# Patient Record
Sex: Female | Born: 1935 | Race: White | Hispanic: No | Smoking: Never smoker
Health system: Southern US, Community
[De-identification: ages and names within clinical notes are randomized; demographics above are authoritative.]

## PROBLEM LIST (undated history)

## (undated) DIAGNOSIS — F419 Anxiety disorder, unspecified: Secondary | ICD-10-CM

## (undated) DIAGNOSIS — F329 Major depressive disorder, single episode, unspecified: Secondary | ICD-10-CM

## (undated) DIAGNOSIS — I639 Cerebral infarction, unspecified: Secondary | ICD-10-CM

## (undated) DIAGNOSIS — I10 Essential (primary) hypertension: Secondary | ICD-10-CM

## (undated) DIAGNOSIS — E785 Hyperlipidemia, unspecified: Secondary | ICD-10-CM

## (undated) DIAGNOSIS — F32A Depression, unspecified: Secondary | ICD-10-CM

## (undated) DIAGNOSIS — I442 Atrioventricular block, complete: Secondary | ICD-10-CM

---
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
## 2001-09-10 ENCOUNTER — Other Ambulatory Visit: Admission: RE | Admit: 2001-09-10 | Discharge: 2001-09-10 | Payer: Self-pay | Admitting: Oncology

## 2001-09-10 ENCOUNTER — Ambulatory Visit (HOSPITAL_COMMUNITY): Admission: RE | Admit: 2001-09-10 | Discharge: 2001-09-10 | Payer: Self-pay | Admitting: Oncology

## 2001-09-10 ENCOUNTER — Encounter: Payer: Self-pay | Admitting: Oncology

## 2004-10-09 ENCOUNTER — Ambulatory Visit: Payer: Self-pay | Admitting: Oncology

## 2005-01-02 ENCOUNTER — Ambulatory Visit: Payer: Self-pay | Admitting: Hematology

## 2005-01-31 ENCOUNTER — Ambulatory Visit: Payer: Self-pay | Admitting: Oncology

## 2005-04-12 ENCOUNTER — Ambulatory Visit: Payer: Self-pay | Admitting: Oncology

## 2005-08-03 ENCOUNTER — Ambulatory Visit: Payer: Self-pay | Admitting: Oncology

## 2005-10-26 ENCOUNTER — Ambulatory Visit: Payer: Self-pay | Admitting: Oncology

## 2005-12-28 ENCOUNTER — Ambulatory Visit: Payer: Self-pay | Admitting: Oncology

## 2006-02-15 ENCOUNTER — Ambulatory Visit: Payer: Self-pay | Admitting: Oncology

## 2006-06-05 ENCOUNTER — Ambulatory Visit: Payer: Self-pay | Admitting: Oncology

## 2006-09-11 ENCOUNTER — Ambulatory Visit: Payer: Self-pay | Admitting: Oncology

## 2006-11-06 ENCOUNTER — Ambulatory Visit: Payer: Self-pay | Admitting: Oncology

## 2007-04-02 ENCOUNTER — Ambulatory Visit: Payer: Self-pay | Admitting: Oncology

## 2013-12-09 ENCOUNTER — Other Ambulatory Visit: Payer: Self-pay | Admitting: Oncology

## 2013-12-09 DIAGNOSIS — C8589 Other specified types of non-Hodgkin lymphoma, extranodal and solid organ sites: Secondary | ICD-10-CM

## 2013-12-17 ENCOUNTER — Ambulatory Visit (HOSPITAL_COMMUNITY)
Admission: RE | Admit: 2013-12-17 | Discharge: 2013-12-17 | Disposition: A | Discharge status: Home / Self Care | Payer: Medicare Other | Source: Ambulatory Visit | Attending: Oncology | Admitting: Oncology

## 2013-12-17 DIAGNOSIS — C8589 Other specified types of non-Hodgkin lymphoma, extranodal and solid organ sites: Secondary | ICD-10-CM | POA: Insufficient documentation

## 2013-12-17 LAB — GLUCOSE, CAPILLARY: Glucose-Capillary: 113 mg/dL — ABNORMAL HIGH (ref 70–99)

## 2013-12-17 MED ORDER — FLUDEOXYGLUCOSE F - 18 (FDG) INJECTION
7.4000 | Freq: Once | INTRAVENOUS | Status: AC | PRN
  Administered 2013-12-17: 7.4 via INTRAVENOUS

## 2015-10-31 IMAGING — CT NM PET TUM IMG RESTAG (PS) SKULL BASE T - THIGH
1 of 6 series · 1 of 25 positions shown · non-contrast
Comparison: None.

CLINICAL DATA: Initial treatment strategy for lymphoma.

EXAM:
NUCLEAR MEDICINE PET SKULL BASE TO THIGH
FASTING BLOOD GLUCOSE:  Value: 113 mg/dl
TECHNIQUE: 7.4 mCi F-18 FDG was injected intravenously. Full-ring PET imaging
was performed from the skull base to thigh after the radiotracer. CT
data was obtained and used for attenuation correction and anatomic
localization.

[Series 3: pet sk_thigh ac · axial · 5.0mm · 4.07mm/px · 1 of 201 slices shown]
[im 101/201]
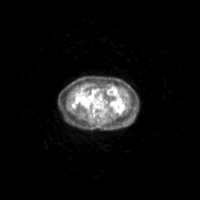

[1 of 25 positions shown; findings below may reference images not displayed]

FINDINGS: NECK

Nonspecific asymmetric increased radiotracer uptake is identified
within the left tonsillar region corresponding to increased soft
tissue fullness on the CT images. The SUV max within this area is
equal to 6.6, image 7. Mild increased radiotracer uptake is
identified within right level 2 lymph node this has an SUV max equal
to 3.7, image 14.

CHEST

No hypermetabolic mediastinal or hilar nodes. No suspicious
pulmonary nodules on the CT scan. Scarring is identified within both
post posterior lung bases.

ABDOMEN/PELVIS

No abnormal hypermetabolic activity within the liver, pancreas,
adrenal glands, or spleen. No hypermetabolic lymph nodes in the
abdomen or pelvis.

SKELETON

Mildly heterogeneous radiotracer uptake is identified throughout the
spine. More focal area of increased radiotracer uptake is identified
at the approximate T7 level.
IMPRESSION: 1. There are no specific features identified to suggest a lymphoma
within the neck chest abdomen or pelvis.
2. Subtle asymmetric increased radiotracer uptake is associated with
the left tonsillar region corresponding to the recent CT of the
neck. Consider further assessment with tissue sampling.
3. Mild increased uptake is associated with right sided level to
lymph node.
4. Heterogeneous of bone marrow activity is noted particularly
involving the spine. If there is a high clinical concern for bone
marrow involvement by lymphoma consider further assessment with bone
marrow biopsy.

## 2015-11-17 DIAGNOSIS — M810 Age-related osteoporosis without current pathological fracture: Secondary | ICD-10-CM

## 2015-11-17 DIAGNOSIS — C8519 Unspecified B-cell lymphoma, extranodal and solid organ sites: Secondary | ICD-10-CM | POA: Diagnosis not present

## 2016-05-16 DIAGNOSIS — Z8572 Personal history of non-Hodgkin lymphomas: Secondary | ICD-10-CM

## 2017-07-04 DIAGNOSIS — C8519 Unspecified B-cell lymphoma, extranodal and solid organ sites: Secondary | ICD-10-CM | POA: Diagnosis not present

## 2019-04-08 ENCOUNTER — Other Ambulatory Visit: Payer: Self-pay | Admitting: Physician Assistant

## 2019-04-08 ENCOUNTER — Other Ambulatory Visit: Payer: Self-pay

## 2019-04-08 ENCOUNTER — Observation Stay (HOSPITAL_COMMUNITY)
Admission: AD | Admit: 2019-04-08 | Discharge: 2019-04-08 | Disposition: A | Discharge status: Home / Self Care | Payer: Medicare (Managed Care) | Source: Other Acute Inpatient Hospital | Attending: Internal Medicine | Admitting: Internal Medicine

## 2019-04-08 ENCOUNTER — Observation Stay (HOSPITAL_BASED_OUTPATIENT_CLINIC_OR_DEPARTMENT_OTHER): Payer: Medicare (Managed Care)

## 2019-04-08 DIAGNOSIS — I69354 Hemiplegia and hemiparesis following cerebral infarction affecting left non-dominant side: Secondary | ICD-10-CM | POA: Insufficient documentation

## 2019-04-08 DIAGNOSIS — Z79899 Other long term (current) drug therapy: Secondary | ICD-10-CM | POA: Diagnosis not present

## 2019-04-08 DIAGNOSIS — I10 Essential (primary) hypertension: Secondary | ICD-10-CM | POA: Insufficient documentation

## 2019-04-08 DIAGNOSIS — J449 Chronic obstructive pulmonary disease, unspecified: Secondary | ICD-10-CM | POA: Insufficient documentation

## 2019-04-08 DIAGNOSIS — F329 Major depressive disorder, single episode, unspecified: Secondary | ICD-10-CM | POA: Insufficient documentation

## 2019-04-08 DIAGNOSIS — R42 Dizziness and giddiness: Secondary | ICD-10-CM | POA: Insufficient documentation

## 2019-04-08 DIAGNOSIS — I447 Left bundle-branch block, unspecified: Secondary | ICD-10-CM | POA: Insufficient documentation

## 2019-04-08 DIAGNOSIS — F419 Anxiety disorder, unspecified: Secondary | ICD-10-CM | POA: Insufficient documentation

## 2019-04-08 DIAGNOSIS — E785 Hyperlipidemia, unspecified: Secondary | ICD-10-CM | POA: Diagnosis not present

## 2019-04-08 DIAGNOSIS — I440 Atrioventricular block, first degree: Secondary | ICD-10-CM | POA: Diagnosis not present

## 2019-04-08 DIAGNOSIS — R531 Weakness: Principal | ICD-10-CM | POA: Insufficient documentation

## 2019-04-08 DIAGNOSIS — R001 Bradycardia, unspecified: Secondary | ICD-10-CM | POA: Diagnosis not present

## 2019-04-08 DIAGNOSIS — R55 Syncope and collapse: Secondary | ICD-10-CM

## 2019-04-08 DIAGNOSIS — Z1159 Encounter for screening for other viral diseases: Secondary | ICD-10-CM | POA: Diagnosis not present

## 2019-04-08 DIAGNOSIS — Z8572 Personal history of non-Hodgkin lymphomas: Secondary | ICD-10-CM | POA: Diagnosis not present

## 2019-04-08 LAB — BASIC METABOLIC PANEL
Anion gap: 7 (ref 5–15)
BUN: 12 mg/dL (ref 8–23)
CO2: 26 mmol/L (ref 22–32)
Calcium: 8.8 mg/dL — ABNORMAL LOW (ref 8.9–10.3)
Chloride: 104 mmol/L (ref 98–111)
Creatinine, Ser: 0.73 mg/dL (ref 0.44–1.00)
GFR calc Af Amer: >60 mL/min (ref >60)
GFR calc non Af Amer: >60 mL/min (ref >60)
Glucose, Bld: 93 mg/dL (ref 70–99)
Potassium: 4 mmol/L (ref 3.5–5.1)
Sodium: 137 mmol/L (ref 135–145)

## 2019-04-08 LAB — SARS CORONAVIRUS 2: SARS Coronavirus 2: NOT DETECTED

## 2019-04-08 LAB — CBC WITH DIFFERENTIAL/PLATELET
Abs Immature Granulocytes: 0.02 10*3/uL (ref 0.00–0.07)
Basophils Absolute: 0 10*3/uL (ref 0.0–0.1)
Basophils Relative: 1 %
Eosinophils Absolute: 0.1 10*3/uL (ref 0.0–0.5)
Eosinophils Relative: 2 %
HCT: 36.6 % (ref 36.0–46.0)
Hemoglobin: 12.4 g/dL (ref 12.0–15.0)
Immature Granulocytes: 0 %
Lymphocytes Relative: 34 %
Lymphs Abs: 1.7 10*3/uL (ref 0.7–4.0)
MCH: 30.8 pg (ref 26.0–34.0)
MCHC: 33.9 g/dL (ref 30.0–36.0)
MCV: 91 fL (ref 80.0–100.0)
Monocytes Absolute: 0.5 10*3/uL (ref 0.1–1.0)
Monocytes Relative: 10 %
Neutro Abs: 2.6 10*3/uL (ref 1.7–7.7)
Neutrophils Relative %: 53 %
Platelets: 202 10*3/uL (ref 150–400)
RBC: 4.02 MIL/uL (ref 3.87–5.11)
RDW: 13.2 % (ref 11.5–15.5)
WBC: 5 10*3/uL (ref 4.0–10.5)
nRBC: 0 % (ref 0.0–0.2)

## 2019-04-08 LAB — TROPONIN I: Troponin I: <0.03 ng/mL (ref <0.03)

## 2019-04-08 LAB — ECHOCARDIOGRAM COMPLETE
Height: 61 in
Weight: 2115.2 oz

## 2019-04-08 LAB — PROTIME-INR
INR: 1.1 (ref 0.8–1.2)
Prothrombin Time: 13.8 seconds (ref 11.4–15.2)

## 2019-04-08 LAB — TSH: TSH: 3.365 u[IU]/mL (ref 0.350–4.500)

## 2019-04-08 LAB — APTT: aPTT: 33 seconds (ref 24–36)

## 2019-04-08 MED ORDER — SERTRALINE HCL 100 MG PO TABS
100.0000 mg | ORAL_TABLET | Freq: Every day | ORAL | Status: DC
  Administered 2019-04-08: 100 mg via ORAL
  Filled 2019-04-08: qty 1

## 2019-04-08 MED ORDER — ROSUVASTATIN CALCIUM 5 MG PO TABS: 10.0000 mg | ORAL_TABLET | Freq: Every day | ORAL | Status: DC

## 2019-04-08 MED ORDER — ACETAMINOPHEN 325 MG PO TABS
650.0000 mg | ORAL_TABLET | Freq: Four times a day (QID) | ORAL | Status: DC | PRN
  Administered 2019-04-08: 650 mg via ORAL
  Filled 2019-04-08: qty 2

## 2019-04-08 MED ORDER — ONDANSETRON HCL 4 MG/2ML IJ SOLN: 4.0000 mg | Freq: Four times a day (QID) | INTRAMUSCULAR | Status: DC | PRN

## 2019-04-08 MED ORDER — SODIUM CHLORIDE 0.9 % IV SOLN
INTRAVENOUS | Status: AC
  Administered 2019-04-08: 15:00:00 via INTRAVENOUS

## 2019-04-08 NOTE — Progress Notes (Signed)
Echocardiogram 2D Echocardiogram has been performed.  Matilde Bash 04/08/2019, 11:06 AM

## 2019-04-08 NOTE — H&P (Signed)
Cardiology History & Physical    Patient ID: Bethany Nichols MRN: [id number], DOB: [birth year] Date of Encounter: 04/08/2019, 4:46 AM Primary Physician: Marco Collie, MD  Chief Complaint: Lightheadedness   HPI: Bethany Nichols is a 85 y.o. female with history of HLD, COPD, prior stroke, remote lymphoma who presents with intermittent lightheadedness.  The pt had been doing well until 1-2 days PTA, when she noted the onset of intermittent lightheadedness.  The symptoms typically came on shortly after standing up and usually resolved within minutes.  She denied any associated CP, SOB, orthopnea, or frank syncope.  She has not had any fevers or chills.  Her son took vital signs at home this afternoon following one of these episodes and noted at HR in th 64s.  She was taken to the [facility] ED for evaluation.  There, her HR was in the 50s-60s and the remainder of her VS were WNL.  ECG showed LBBB and first degree AVB with occasional PACs.  Orthostatic vital signs were WNL.  Her labs were largely unremarkable, save for Na 131.  Her Cr was 0.7 and CBC was WNL.  Initial troponin and COVID screening swab were both negative.  Given concerns for possible bradyarrhythmia, she was transferred to [facility] for further management.   PMH: HLD Stroke Lymphoma COPD Anxiety  Home Meds: Sertraline 100 mg QD Crestor 10 mg QD Clonazepam 0.5 mg QHS PRN Clopidogrel 75 mg QD Duoneb PRN  Allergies:  Allergies  Allergen Reactions  . Levofloxacin Rash    Social History   Socioeconomic History  . Marital status: Divorced    Spouse name: Not on file  . Number of children: Not on file  . Years of education: Not on file  . Highest education level: Not on file  Occupational History  . Not on file  Social Needs  . Financial resource strain: Not on file  . Food insecurity:    Worry: Not on file    Inability: Not on file  . Transportation needs:    Medical: Not on file    Non-medical: Not on file   Tobacco Use  . Smoking status: Not on file  Substance and Sexual Activity  . Alcohol use: Not on file  . Drug use: Not on file  . Sexual activity: Not on file  Lifestyle  . Physical activity:    Days per week: Not on file    Minutes per session: Not on file  . Stress: Not on file  Relationships  . Social connections:    Talks on phone: Not on file    Gets together: Not on file    Attends religious service: Not on file    Active member of club or organization: Not on file    Attends meetings of clubs or organizations: Not on file    Relationship status: Not on file  . Intimate partner violence:    Fear of current or ex partner: Not on file    Emotionally abused: Not on file    Physically abused: Not on file    Forced sexual activity: Not on file  Other Topics Concern  . Not on file  Social History Narrative  . Not on file     No family history on file.  Review of Systems: All other systems reviewed and are otherwise negative except as noted above.  Labs: BMP: 131/4.4  96/28  20/0.70 < 94 CBC: 5.5 > 12.2/36.0 < 216 Troponin I < 0.01 COVID PCR:  Negative  Radiology/Studies:  No results found. Wt Readings from Last 3 Encounters:  04/08/19 60 kg    EKG: Sinus bradycardia, rate 50s, occasional PAC followed by compensatory pause (approx 2s).  LBBB, 1st degree AVB.  Physical Exam: Blood pressure 137/75, pulse (!) 56, temperature 98.2 F (36.8 C), temperature source Oral, resp. rate 15, height 5\' 1"  (1.549 m), weight 60 kg, SpO2 96 %. Body mass index is 24.98 kg/m. General: Well developed, well nourished, in no acute distress. Head: Normocephalic, atraumatic, sclera non-icteric, no xanthomas, nares are without discharge.  Neck: Negative for carotid bruits. JVD not elevated. Lungs: Clear bilaterally to auscultation without wheezes, rales, or rhonchi. Breathing is unlabored. Heart: RRR with S1 S2. No murmurs, rubs, or gallops appreciated. Abdomen: Soft, non-tender,  non-distended with normoactive bowel sounds. No hepatomegaly. No rebound/guarding. No obvious abdominal masses. Msk:  Strength and tone appear normal for age. Extremities: No clubbing or cyanosis. No edema.  Distal pedal pulses are 2+ and equal bilaterally. Neuro: Alert and oriented X 3. No focal deficit. No facial asymmetry. Moves all extremities spontaneously. Psych:  Responds to questions appropriately with a normal affect.    Assessment and Plan  89F presents with intermittent lightheadedness, transferred to evaluate for possible underlying bradyarrythmia.  1.  Pre-syncope:  ECG with evidence of conduction disease - 1st degree AVB and LBBB.  Her 12 lead from [place] shows a PAC with a compensatory pause of nearly 2 seconds.  Thus far, no evidence of high degree AVB.  She requires some telemetry monitoring upfront as she does not meet clear indication for PPM at present.  Will recheck orthostatic VS.  Echocardiogram ordered.  2.  HLD: Continue home Crestor.  3.  Prior stroke: Have not re-ordered home Plavix yet, if she were to require a PPM.  4.  COPD: Duonebs PRN.  5.  Anxiety: Continue home sertraline.  6.  Code status: Full code, confirmed.  She has previously been DNR, but given her admission with a possible cardiac issue, she would prefer to remain full code while admitted.  Signed, Doylene Canning, MD 04/08/2019, 4:46 AM

## 2019-04-08 NOTE — Discharge Instructions (Signed)
Make sure you are eating and drinking adequate amount of water daily.  When standing up, do so slowly and carefully, monitoring for any symptoms.  Sit back down if needed.  If you do not receive the heart monitor at your home in the mail, please call Dr. Macky Lower main office to follow up [phone]

## 2019-04-08 NOTE — Discharge Summary (Addendum)
DISCHARGE SUMMARY    Patient ID: Bethany Nichols,  MRN: [id number], DOB/AGE: 1932/12/27 85 y.o.  Admit date: 04/08/2019 Discharge date: 04/08/2019  Primary Care Physician: Dr. Charletta Cousin, MD Darci Current, NP Primary Cardiologist/Electrophysiologist:   Primary Discharge Diagnosis:  1. Weakness, dizziness  Secondary Discharge Diagnosis:  1. HTN 2. HLD 3. Depression/anxiety 4. H/o prior stroke   Allergies  Allergen Reactions   Levofloxacin Rash   Allegra [Fexofenadine] Other (See Comments)    unknown     Procedures This Admission:     Brief HPI: Bethany Nichols is a 85 y.o. female with a hx of AAA, depression/anxiety, HLD, HTN, remote lymphoma (2001), old stroke with L sided weakness, resides at home with family near by, was taken to [facility] with concners of new reports to family of some lightheadedness and by a pulse check by her son reportedly got a pulse in the 30's.  At [facility] she was found in [place] generally 50's-60's, 1st degree AVblock and LBBB on her EKG, labs reported as unremarkable, though with reports by family of slow HR, transferred to [facility] for further evaluation with concerns of possible symptomatic bradycardia, ?abnormal EKG.     Hospital Course:  The patient was admitted and monitored on telemetry.  She has not had any particular symptoms here.   In lengthy discussion with the patient and in d/w her son via telephone, her symptoms are more suggestive of orthostatic dizziness.  Telemetry noted SB/SR generally 50's-60's, occ PACs and PVCs.  Her son reports the pulse ox read a HR of 31, as did a manual pulse taken by home.  Suspect this may have been secondary to her ectopy.  She has 1st degree AVblock and a LBBB without old EKGs to compare to.  Echo was done noting LVEF 60-65% without significant valvular heart disease.  The patient had positive orthostatic vitals.  She was ordered for a bolus of fluid.  Discussed with the patient and her  son via telephone suspect a component of dehydration, the importance of adequate oral intake with food and water.  Also discussed care upon standing and safety strategies.  Our office Larry Alcock send her an event monitor (Zio AT) for home enrollment and follow up with Dr. Curt Bears is in place   The patient was seen and examined by Dr. Curt Bears and considered stable for discharge to home.    Physical Exam: Vitals:   04/08/19 0351 04/08/19 0646 04/08/19 0817  BP: 137/75 130/73 133/73  Pulse: (!) 56 (!) 47 (!) 51  Resp: 15 (!) 21 17  Temp: 98.2 F (36.8 C) 97.8 F (36.6 C) 97.9 F (36.6 C)  TempSrc: Oral Oral Oral  SpO2: 96% 100% 94%  Weight: 60 kg    Height: 5\' 1"  (1.549 m)       Labs:   Lab Results  Component Value Date   WBC 5.0 04/08/2019   HGB 12.4 04/08/2019   HCT 36.6 04/08/2019   MCV 91.0 04/08/2019   PLT 202 04/08/2019    Recent Labs  Lab 04/08/19 0546  NA 137  K 4.0  CL 104  CO2 26  BUN 12  CREATININE 0.73  CALCIUM 8.8*  GLUCOSE 93    Discharge Medications:  Allergies as of 04/08/2019      Reactions   Levofloxacin Rash   Allegra [fexofenadine] Other (See Comments)   unknown      Medication List    TAKE these medications   acetaminophen 650  MG CR tablet Commonly known as:  TYLENOL Take 650 mg by mouth every 12 (twelve) hours as needed for pain.   alendronate 70 MG tablet Commonly known as:  FOSAMAX Take 70 mg by mouth once a week. Take with a full glass of water on an empty stomach.   aspirin EC 81 MG tablet Take 81 mg by mouth daily.   b complex vitamins tablet Take 1 tablet by mouth daily.   calcium carbonate 1500 (600 Ca) MG Tabs tablet Commonly known as:  OSCAL Take 600 mg of elemental calcium by mouth 2 (two) times daily with a meal.   calcium carbonate 500 MG chewable tablet Commonly known as:  TUMS - dosed in mg elemental calcium Chew 1 tablet by mouth 2 (two) times daily as needed for indigestion or heartburn.   cetaphil cream Apply  1 application topically 2 (two) times daily as needed (dry skin).   Melatonin 5 MG Tabs Take 5 mg by mouth at bedtime.   Muscle Rub 10-15 % Crea Apply 1 application topically 3 (three) times daily as needed for muscle pain.   sertraline 50 MG tablet Commonly known as:  ZOLOFT Take 50 mg by mouth daily.   vitamin E 1000 UNIT capsule Take 1,000 Units by mouth daily.       Disposition:  Home Discharge Instructions    Diet - low sodium heart healthy   Complete by:  As directed    Increase activity slowly   Complete by:  As directed      Follow-up Information    Constance Haw, MD Follow up.   Specialty:  Cardiology Why:  05/15/2019 @ 11:00AM, this Hasnain Manheim be a telephone/virtual visit.  If this is changed to an in-clinic visit (pending [place] clinic restrictions) you Truly Stankiewicz be notified Contact information: [place] [address] [phone]           Duration of Discharge Encounter: Greater than 30 minutes including physician time.  Venetia Night, PA-C 04/08/2019 3:53 PM  I have seen and examined this patient with Tommye Standard.  Agree with above, note added to reflect my findings.  On exam, RRR, no murmurs, lungs clear.  Patient presented to the hospital with dizziness and fatigue.  She does have a first-degree AV block and left bundle branch block.  Per her description, it sounds that her dizziness and fatigue is due to orthostasis, but with her conduction system disease, it would certainly benefit further monitoring.  We Kriti Katayama fit her with a ZIO patch.  Doninique Lwin M. Amaal Dimartino MD 04/09/2019 6:28 AM

## 2019-04-08 NOTE — Consult Note (Addendum)
Cardiology Consultation:   Patient ID: [name] MRN: [id number]; DOB: 1933-05-20  Admit date: 04/08/2019 Date of Consult: 04/08/2019  Primary Care Provider: Marco Collie, MD Primary Cardiologist: No primary care provider on file.  Primary Electrophysiologist:  None    Patient Profile:   Bethany Nichols is a 85 y.o. female with a hx of AAA, depression/anxiety, HLD, HTN, remote lymphoma (2001), old stroke with L sided weakness, resides at home with family near by who is being seen today for the evaluation of concerns of bardycardia at the request of Dr. [place] Nichols.  History of Present Illness:   [name] was taken to [facility] with concners of new reports to family of some lightheadedness and by a pulse check by her son reportedly got a pulse in the 30's.  At [facility] she was found in [place] generally 50's-60's, 1st degree AVblock and LBBB on her EKG, labs reported as unremarkable, though with reports by family of slow HR, transferred to [facility] for further evaluation with concerns of possible symptomatic bradycardia, ?abnormal EKG.    Colon 04/07/2019 K+ 4.4 Mag 2.30 BUN/Creat 20/0.70 Trop I: <0.01 WBC 5.5 H/H 12/36 Plts 216  COVID negative CXR w/NAD  EKG SB,, 57bpm, 1st degree AVblock, PR 260ms, PAC w/2 sec compensatory pause, LBBB Rhythm strips SR, 55-60bpm, PVC   The patient this am reports for about a week, she has been getting dizzy when she stands up, mentions once particularly getting up from the toilet.  She feels in general like her legs (at baseline she says are not strong since her stroke years ago) feel a little more weak then usual, generally more fatigued.  She denies to [place] today at any time feeling like she was going to faint, or fall, denies any near fainting.  She denies any kind of CP, palpitations or SOB.  She has had no cardiac awareness of any kind.  I spoke with her son Bethany Nichols, he confirms the patient lives independently and he lives  close by.  He reports that for a couple weeks his mom has mentioned [erhaps tired, generally weak and some dizzy spells, uncl;ear to him if only upon standing.  Yesterday particularly reported to him just not feeling well, he went to check on her said she did look like she was not feeling well, perhaps a bit lethargic even, and with a pulse ox, got HR of 31, sats in the high 90's.  He took a manual pulse and also got 30's, this is what prompted bringing the patient to the hospital.   Home Medications:  Prior to Admission medications   Not on File  Reviewed home list on hard copy chart, she is on no nodal blocking agents, mostly on minerals and vitamimns  Inpatient Medications: Scheduled Meds: . rosuvastatin  10 mg Oral q1800  . sertraline  100 mg Oral Daily   Continuous Infusions:  PRN Meds: acetaminophen, ondansetron (ZOFRAN) IV  Allergies:    Allergies  Allergen Reactions  . Levofloxacin Rash    Social History:   Social History   Socioeconomic History  . Marital status: Divorced    Spouse name: Not on file  . Number of children: Not on file  . Years of education: Not on file  . Highest education level: Not on file  Occupational History  . Not on file  Social Needs  . Financial resource strain: Not on file  . Food insecurity:    Worry: Not on file    Inability: Not  on file  . Transportation needs:    Medical: Not on file    Non-medical: Not on file  Tobacco Use  . Smoking status: Not on file  Substance and Sexual Activity  . Alcohol use: Not on file  . Drug use: Not on file  . Sexual activity: Not on file  Lifestyle  . Physical activity:    Days per week: Not on file    Minutes per session: Not on file  . Stress: Not on file  Relationships  . Social connections:    Talks on phone: Not on file    Gets together: Not on file    Attends religious service: Not on file    Active member of club or organization: Not on file    Attends meetings of clubs or  organizations: Not on file    Relationship status: Not on file  . Intimate partner violence:    Fear of current or ex partner: Not on file    Emotionally abused: Not on file    Physically abused: Not on file    Forced sexual activity: Not on file  Other Topics Concern  . Not on file  Social History Narrative  . Not on file    Family History:   Mother had breast cancer No cardiac family hx noted  ROS:  Please see the history of present illness.  All other ROS reviewed and negative.     Physical Exam/Data:   Vitals:   04/08/19 0351 04/08/19 0646 04/08/19 0817  BP: 137/75 130/73 133/73  Pulse: (!) 56 (!) 47 (!) 51  Resp: 15 (!) 21 17  Temp: 98.2 F (36.8 C) 97.8 F (36.6 C) 97.9 F (36.6 C)  TempSrc: Oral Oral Oral  SpO2: 96% 100% 94%  Weight: 60 kg    Height: 5\' 1"  (1.549 m)      Intake/Output Summary (Last 24 hours) at 04/08/2019 1023 Last data filed at 04/08/2019 0900 Gross per 24 hour  Intake 240 ml  Output 600 ml  Net -360 ml   Last 3 Weights 04/08/2019  Weight (lbs) 132 lb 3.2 oz  Weight (kg) 59.966 kg     Body mass index is 24.98 kg/m.  General:  Well nourished, well developed, in no acute distress HEENT: normal Lymph: no adenopathy Neck: no JVD Endocrine:  No thryomegaly Vascular: No carotid bruits Cardiac:  RRR; no murmurs, gallops or rubs Lungs:  CTA b/l, no wheezing, rhonchi or rales  Abd: soft, nontender Ext: no edema Musculoskeletal:  No deformities, age appropriate atrophy Skin: warm and dry  Neuro:  no focal abnormalities noted, pt reports leg weakness since her stroke Psych:  Normal affect, very pleasent  EKG:  The EKG was personally reviewed and demonstrates:    SR 61, 1st degree AVblock  251ms, , LBBB Telemetry:  Telemetry was personally reviewed and demonstrates:   SR/SB 50's-60's, occ PACs and PVCs, infrequent couplets, she has very brief compensatory pauses with narrow complex sinus beat afterwards No advanced heart block or marked  bradycardia is obsevrved  Relevant CV Studies:  Echo  Is completed, pending read  Laboratory Data:  Chemistry Recent Labs  Lab 04/08/19 0546  NA 137  K 4.0  CL 104  CO2 26  GLUCOSE 93  BUN 12  CREATININE 0.73  CALCIUM 8.8*  GFRNONAA >60  GFRAA >60  ANIONGAP 7    No results for input(s): PROT, ALBUMIN, AST, ALT, ALKPHOS, BILITOT in the last 168 hours. Hematology Recent Labs  Lab 04/08/19 0546  WBC 5.0  RBC 4.02  HGB 12.4  HCT 36.6  MCV 91.0  MCH 30.8  MCHC 33.9  RDW 13.2  PLT 202   Cardiac Enzymes Recent Labs  Lab 04/08/19 0546  TROPONINI <0.03   No results for input(s): TROPIPOC in the last 168 hours.  BNPNo results for input(s): BNP, PROBNP in the last 168 hours.  DDimer No results for input(s): DDIMER in the last 168 hours.  Radiology/Studies:  No results found.  Assessment and Plan:   1. Vague weakness in the last week, more so them her baseline     Dizziness upon standing from the toilet     Legs generally seem weaker then usual, feels more unsteady on her feet     No reports of near syncope or syncope  Story sounds more of orthostatic symptoms She has intermittent ectopy that may account for slow pulse by the pulse ox/manual pulse noted by family at home She has baseline conduction system disease, no old EKGs to know if LBBB is new  Hard to make a case for symptomatic bradycardia at this juncture  Avyonna Wagoner get a set of orthostatic vitals done The patient encouraged to keep adequate oral hydration and intake Echo is completed, pending read  Likely to discharge with event monitoring (Zio AT) and outpatient follow up.   For questions or updates, please contact [place] Please consult [email or web address] for contact info under     Signed, Baldwin Jamaica, PA-C  04/08/2019 10:23 AM  I have seen and examined this patient with Tommye Standard.  Agree with above, note added to reflect my findings.  On exam, RRR, no murmurs, lungs clear.  Patient  present to the hospital with episodes of fatigue.  She was noted to have heart rates in the 30s at home.  This point is unclear to me as to what is causing this.  It could certainly be PVC induced versus heart block.  She does have conduction system disease.  Telemetry today shows evidence of likely aberrancy with long short intervals, though the short intervals are quite normal.  She has not had any atrial fibrillation or significant bradycardia.  Due to that, we Britanie Harshman likely fit her with a ZIO patch as an outpatient.  Darthula Desa M. Celise Bazar MD 04/08/2019 12:55 PM

## 2019-04-08 NOTE — Progress Notes (Signed)
IV and telemetry discontinued at this time. CCMD notified. Discharge instructions reviewed with patient and patient's son, [name] over the phone.

## 2019-04-09 ENCOUNTER — Telehealth: Payer: Self-pay | Admitting: Radiology

## 2019-04-09 NOTE — Telephone Encounter (Signed)
Enrolled patient for a 14 Day Zio Telemetry monitor to be mailed. Brief instructions were gone over and she knows to expect the monitor to arrive in 3-4 days

## 2019-04-12 ENCOUNTER — Telehealth: Payer: Self-pay | Admitting: Student

## 2019-04-12 NOTE — Telephone Encounter (Signed)
    Patient's sister called the after hours line reporting the patient had episodes of bradycardia and dizziness earlier this morning. Was recently admitted with similar symptoms. Not on any AV nodal blocking agents.   HR checked at time of call and improved into the 60's. Patient denies any symptoms at this time.   Zio monitor was ordered at the time of discharge and should be arriving within the next 1-2 days. I told them to call the office if this has not arrived by mid-week.  They will continue to monitor her HR and BP. Will forward to [facility] and Dr. Curt Bears to make them aware.   Signed, Erma Heritage, PA-C 04/12/2019, 3:31 PM Pager: [phone]

## 2019-04-13 ENCOUNTER — Ambulatory Visit (INDEPENDENT_AMBULATORY_CARE_PROVIDER_SITE_OTHER): Payer: Medicare (Managed Care)

## 2019-04-13 DIAGNOSIS — R42 Dizziness and giddiness: Secondary | ICD-10-CM

## 2019-04-16 ENCOUNTER — Encounter (HOSPITAL_COMMUNITY): Payer: Self-pay | Admitting: Nurse Practitioner

## 2019-04-16 ENCOUNTER — Encounter (HOSPITAL_COMMUNITY)
Admission: RE | Disposition: A | Discharge status: Home / Self Care | Payer: Self-pay | Source: Ambulatory Visit | Attending: Cardiology

## 2019-04-16 ENCOUNTER — Other Ambulatory Visit (HOSPITAL_COMMUNITY): Payer: Self-pay | Admitting: *Deleted

## 2019-04-16 ENCOUNTER — Telehealth: Payer: Self-pay | Admitting: Cardiology

## 2019-04-16 ENCOUNTER — Ambulatory Visit (HOSPITAL_COMMUNITY)
Admission: RE | Admit: 2019-04-16 | Discharge: 2019-04-17 | Disposition: A | Discharge status: Home / Self Care | Payer: No Typology Code available for payment source | Source: Ambulatory Visit | Attending: Cardiology | Admitting: Cardiology

## 2019-04-16 ENCOUNTER — Other Ambulatory Visit (HOSPITAL_COMMUNITY)
Admission: RE | Admit: 2019-04-16 | Discharge: 2019-04-16 | Disposition: A | Discharge status: Home / Self Care | Payer: No Typology Code available for payment source | Source: Ambulatory Visit | Attending: Cardiology | Admitting: Cardiology

## 2019-04-16 ENCOUNTER — Telehealth: Payer: Self-pay | Admitting: Nurse Practitioner

## 2019-04-16 ENCOUNTER — Other Ambulatory Visit: Payer: Self-pay

## 2019-04-16 DIAGNOSIS — Z7982 Long term (current) use of aspirin: Secondary | ICD-10-CM | POA: Insufficient documentation

## 2019-04-16 DIAGNOSIS — Z888 Allergy status to other drugs, medicaments and biological substances status: Secondary | ICD-10-CM | POA: Diagnosis not present

## 2019-04-16 DIAGNOSIS — F329 Major depressive disorder, single episode, unspecified: Secondary | ICD-10-CM | POA: Insufficient documentation

## 2019-04-16 DIAGNOSIS — I700 Atherosclerosis of aorta: Secondary | ICD-10-CM | POA: Insufficient documentation

## 2019-04-16 DIAGNOSIS — Z95818 Presence of other cardiac implants and grafts: Secondary | ICD-10-CM

## 2019-04-16 DIAGNOSIS — E785 Hyperlipidemia, unspecified: Secondary | ICD-10-CM | POA: Insufficient documentation

## 2019-04-16 DIAGNOSIS — M40294 Other kyphosis, thoracic region: Secondary | ICD-10-CM | POA: Insufficient documentation

## 2019-04-16 DIAGNOSIS — Z1159 Encounter for screening for other viral diseases: Secondary | ICD-10-CM | POA: Diagnosis not present

## 2019-04-16 DIAGNOSIS — Z79899 Other long term (current) drug therapy: Secondary | ICD-10-CM | POA: Diagnosis not present

## 2019-04-16 DIAGNOSIS — Z881 Allergy status to other antibiotic agents status: Secondary | ICD-10-CM | POA: Insufficient documentation

## 2019-04-16 DIAGNOSIS — I10 Essential (primary) hypertension: Secondary | ICD-10-CM | POA: Insufficient documentation

## 2019-04-16 DIAGNOSIS — Z8673 Personal history of transient ischemic attack (TIA), and cerebral infarction without residual deficits: Secondary | ICD-10-CM | POA: Diagnosis not present

## 2019-04-16 DIAGNOSIS — I442 Atrioventricular block, complete: Secondary | ICD-10-CM | POA: Insufficient documentation

## 2019-04-16 DIAGNOSIS — Z8249 Family history of ischemic heart disease and other diseases of the circulatory system: Secondary | ICD-10-CM | POA: Insufficient documentation

## 2019-04-16 DIAGNOSIS — F419 Anxiety disorder, unspecified: Secondary | ICD-10-CM | POA: Diagnosis not present

## 2019-04-16 HISTORY — DX: Hyperlipidemia, unspecified: E78.5

## 2019-04-16 HISTORY — DX: Atrioventricular block, complete: I44.2

## 2019-04-16 HISTORY — DX: Anxiety disorder, unspecified: F41.9

## 2019-04-16 HISTORY — DX: Essential (primary) hypertension: I10

## 2019-04-16 HISTORY — DX: Cerebral infarction, unspecified: I63.9

## 2019-04-16 HISTORY — DX: Depression, unspecified: F32.A

## 2019-04-16 HISTORY — PX: PACEMAKER IMPLANT: EP1218

## 2019-04-16 LAB — SURGICAL PCR SCREEN
MRSA, PCR: NEGATIVE
Staphylococcus aureus: POSITIVE — AB

## 2019-04-16 LAB — SARS CORONAVIRUS 2 BY RT PCR (HOSPITAL ORDER, PERFORMED IN ~~LOC~~ HOSPITAL LAB): SARS Coronavirus 2: NEGATIVE

## 2019-04-16 SURGERY — PACEMAKER IMPLANT

## 2019-04-16 MED ORDER — SODIUM CHLORIDE 0.9 % IV SOLN
INTRAVENOUS | Status: AC
  Filled 2019-04-16: qty 2

## 2019-04-16 MED ORDER — CHLORHEXIDINE GLUCONATE CLOTH 2 % EX PADS
6.0000 | MEDICATED_PAD | Freq: Every day | CUTANEOUS | Status: DC
  Administered 2019-04-16: 6 via TOPICAL

## 2019-04-16 MED ORDER — SODIUM CHLORIDE 0.9 % IV SOLN
80.0000 mg | INTRAVENOUS | Status: AC
  Administered 2019-04-16: 80 mg

## 2019-04-16 MED ORDER — ACETAMINOPHEN 325 MG PO TABS
325.0000 mg | ORAL_TABLET | ORAL | Status: DC | PRN
  Administered 2019-04-16 – 2019-04-17 (×2): 650 mg via ORAL
  Filled 2019-04-16 (×2): qty 2

## 2019-04-16 MED ORDER — CALCIUM CARBONATE 1500 (600 CA) MG PO TABS
600.0000 mg | ORAL_TABLET | Freq: Two times a day (BID) | ORAL | Status: DC
  Filled 2019-04-16 (×2): qty 1

## 2019-04-16 MED ORDER — CALCIUM CARBONATE ANTACID 500 MG PO CHEW: 1.0000 | CHEWABLE_TABLET | Freq: Two times a day (BID) | ORAL | Status: DC | PRN

## 2019-04-16 MED ORDER — MUPIROCIN 2 % EX OINT
TOPICAL_OINTMENT | CUTANEOUS | Status: AC
  Administered 2019-04-16: 13:00:00
  Filled 2019-04-16: qty 22

## 2019-04-16 MED ORDER — HEPARIN (PORCINE) IN NACL 1000-0.9 UT/500ML-% IV SOLN
INTRAVENOUS | Status: DC | PRN
  Administered 2019-04-16: 500 mL

## 2019-04-16 MED ORDER — B COMPLEX PO TABS: 1.0000 | ORAL_TABLET | Freq: Every day | ORAL | Status: DC

## 2019-04-16 MED ORDER — HEPARIN (PORCINE) IN NACL 1000-0.9 UT/500ML-% IV SOLN
INTRAVENOUS | Status: AC
  Filled 2019-04-16: qty 500

## 2019-04-16 MED ORDER — SERTRALINE HCL 50 MG PO TABS
50.0000 mg | ORAL_TABLET | Freq: Every day | ORAL | Status: DC
  Administered 2019-04-17: 50 mg via ORAL
  Filled 2019-04-16 (×2): qty 1

## 2019-04-16 MED ORDER — MUPIROCIN 2 % EX OINT
1.0000 "application " | TOPICAL_OINTMENT | Freq: Two times a day (BID) | CUTANEOUS | Status: DC
  Administered 2019-04-16 – 2019-04-17 (×2): 1 via NASAL
  Filled 2019-04-16: qty 22

## 2019-04-16 MED ORDER — ASPIRIN EC 81 MG PO TBEC
81.0000 mg | DELAYED_RELEASE_TABLET | Freq: Every day | ORAL | Status: DC
  Administered 2019-04-16 – 2019-04-17 (×2): 81 mg via ORAL
  Filled 2019-04-16 (×2): qty 1

## 2019-04-16 MED ORDER — ONDANSETRON HCL 4 MG/2ML IJ SOLN: 4.0000 mg | Freq: Four times a day (QID) | INTRAMUSCULAR | Status: DC | PRN

## 2019-04-16 MED ORDER — MELATONIN 3 MG PO TABS
6.0000 mg | ORAL_TABLET | Freq: Every day | ORAL | Status: DC
  Administered 2019-04-16: 6 mg via ORAL
  Filled 2019-04-16 (×2): qty 2

## 2019-04-16 MED ORDER — LIDOCAINE HCL 1 % IJ SOLN
INTRAMUSCULAR | Status: AC
  Filled 2019-04-16: qty 60

## 2019-04-16 MED ORDER — ALENDRONATE SODIUM 70 MG PO TABS: 70.0000 mg | ORAL_TABLET | ORAL | Status: DC

## 2019-04-16 MED ORDER — LIDOCAINE HCL (PF) 1 % IJ SOLN
INTRAMUSCULAR | Status: DC | PRN
  Administered 2019-04-16: 50 mL

## 2019-04-16 MED ORDER — CALCIUM CARBONATE 1250 (500 CA) MG PO TABS
1.0000 | ORAL_TABLET | Freq: Two times a day (BID) | ORAL | Status: DC
  Administered 2019-04-16 – 2019-04-17 (×2): 500 mg via ORAL
  Filled 2019-04-16 (×2): qty 1

## 2019-04-16 MED ORDER — SODIUM CHLORIDE 0.9 % IV SOLN
INTRAVENOUS | Status: DC
  Administered 2019-04-16: 13:00:00 via INTRAVENOUS

## 2019-04-16 MED ORDER — VITAMIN E 45 MG (100 UNIT) PO CAPS: 1000.0000 [IU] | ORAL_CAPSULE | Freq: Every day | ORAL | Status: DC

## 2019-04-16 MED ORDER — CEFAZOLIN SODIUM-DEXTROSE 2-4 GM/100ML-% IV SOLN
2.0000 g | INTRAVENOUS | Status: AC
  Administered 2019-04-16: 2 g via INTRAVENOUS

## 2019-04-16 MED ORDER — CEFAZOLIN SODIUM-DEXTROSE 2-4 GM/100ML-% IV SOLN
INTRAVENOUS | Status: AC
  Filled 2019-04-16: qty 100

## 2019-04-16 MED ORDER — CETAPHIL EX CREA: 1.0000 "application " | TOPICAL_CREAM | Freq: Two times a day (BID) | CUTANEOUS | Status: DC | PRN

## 2019-04-16 MED ORDER — CEFAZOLIN SODIUM-DEXTROSE 1-4 GM/50ML-% IV SOLN
1.0000 g | Freq: Four times a day (QID) | INTRAVENOUS | Status: AC
  Administered 2019-04-16 – 2019-04-17 (×3): 1 g via INTRAVENOUS
  Filled 2019-04-16 (×3): qty 50

## 2019-04-16 MED ORDER — CHLORHEXIDINE GLUCONATE 4 % EX LIQD
60.0000 mL | Freq: Once | CUTANEOUS | Status: DC
  Filled 2019-04-16: qty 60

## 2019-04-16 SURGICAL SUPPLY — 7 items
CABLE SURGICAL S-101-97-12 (CABLE) ×3 IMPLANT
LEAD TENDRIL MRI 46CM LPA1200M (Lead) ×3 IMPLANT
LEAD TENDRIL MRI 52CM LPA1200M (Lead) ×3 IMPLANT
PACEMAKER ASSURITY DR-RF (Pacemaker) ×3 IMPLANT
PAD PRO RADIOLUCENT 2001M-C (PAD) ×3 IMPLANT
SHEATH CLASSIC 8F (SHEATH) ×6 IMPLANT
TRAY PACEMAKER INSERTION (PACKS) ×3 IMPLANT

## 2019-04-16 NOTE — H&P (Addendum)
ELECTROPHYSIOLOGY CONSULT NOTE    Patient ID: Bethany Nichols MRN: [id number], DOB/AGE: 1933-04-20 85 y.o.  Admit date: 04/16/2019 Date of Consult: 04/16/2019  Primary Physician: Marco Collie, MD Electrophysiologist: Curt Bears  Patient Profile: Bethany Nichols is a 85 y.o. female with a history of hypertension, hyperlipidemia, depression/anxiety, prior CVA who was admitted 04/08/19 for weakness and dizziness. Zio monitor was placed at discharge. Last night, she had intermittent complete heart block with pauses up to 7 seconds.  She was called this morning and was asymptomatic. However, with history of weakness and dizziness correlating to bradycardia at home, pacemaker has been recommended. Her son brought her to [facility] today for pacemaker implant with Dr Curt Bears.  Echo 04/08/19 demonstrated EF 60-65%.  She currently feels well and denies chest pain, palpitations, dyspnea, PND, orthopnea, nausea, vomiting, dizziness, syncope, edema, weight gain, or early satiety.  Past Medical History:  Diagnosis Date  . Anxiety   . Complete heart block ([place])   . CVA (cerebral vascular accident) ([place])   . Depression   . Hyperlipidemia   . Hypertension      Medications Prior to Admission  Medication Sig Dispense Refill Last Dose  . acetaminophen (TYLENOL) 650 MG CR tablet Take 650 mg by mouth every 12 (twelve) hours as needed for pain.     Marland Kitchen alendronate (FOSAMAX) 70 MG tablet Take 70 mg by mouth once a week. Take with a full glass of water on an empty stomach.     Marland Kitchen aspirin EC 81 MG tablet Take 81 mg by mouth daily.     Marland Kitchen b complex vitamins tablet Take 1 tablet by mouth daily.     . calcium carbonate (OSCAL) 1500 (600 Ca) MG TABS tablet Take 600 mg of elemental calcium by mouth 2 (two) times daily with a meal.     . calcium carbonate (TUMS - DOSED IN MG ELEMENTAL CALCIUM) 500 MG chewable tablet Chew 1 tablet by mouth 2 (two) times daily as needed for indigestion or heartburn.     . cetaphil  (CETAPHIL) cream Apply 1 application topically 2 (two) times daily as needed (dry skin).     . Melatonin 5 MG TABS Take 5 mg by mouth at bedtime.     . Menthol-Methyl Salicylate (MUSCLE RUB) 10-15 % CREA Apply 1 application topically 3 (three) times daily as needed for muscle pain.     Marland Kitchen sertraline (ZOLOFT) 50 MG tablet Take 50 mg by mouth daily.     . vitamin E 1000 UNIT capsule Take 1,000 Units by mouth daily.       Inpatient Medications:  . mupirocin ointment      . chlorhexidine  60 mL Topical Once  . gentamicin irrigation  80 mg Irrigation On Call    Allergies:  Allergies  Allergen Reactions  . Levofloxacin Rash  . Allegra [Fexofenadine] Other (See Comments)    unknown    Social History   Socioeconomic History  . Marital status: Divorced    Spouse name: Not on file  . Number of children: Not on file  . Years of education: Not on file  . Highest education level: Not on file  Occupational History  . Not on file  Social Needs  . Financial resource strain: Not on file  . Food insecurity    Worry: Not on file    Inability: Not on file  . Transportation needs    Medical: Not on file    Non-medical: Not on file  Tobacco  Use  . Smoking status: Not on file  Substance and Sexual Activity  . Alcohol use: Not on file  . Drug use: Not on file  . Sexual activity: Not on file  Lifestyle  . Physical activity    Days per week: Not on file    Minutes per session: Not on file  . Stress: Not on file  Relationships  . Social Herbalist on phone: Not on file    Gets together: Not on file    Attends religious service: Not on file    Active member of club or organization: Not on file    Attends meetings of clubs or organizations: Not on file    Relationship status: Not on file  . Intimate partner violence    Fear of current or ex partner: Not on file    Emotionally abused: Not on file    Physically abused: Not on file    Forced sexual activity: Not on file  Other  Topics Concern  . Not on file  Social History Narrative  . Not on file     Family History: HTN  Review of Systems: All other systems reviewed and are otherwise negative except as noted above.  Physical Exam: Vitals:   04/16/19 1201  BP: 118/69  Pulse: (!) 57  Resp: 16  Temp: (!) 97.3 F (36.3 C)  TempSrc: Skin  SpO2: 97%  Weight: 57.6 kg  Height: 5\' 1"  (1.549 m)    GEN- The patient is elderly and frail appearing, alert and oriented x 3 today.   HEENT: normocephalic, atraumatic; sclera clear, conjunctiva pink; hearing intact; oropharynx clear; neck supple Lungs- Clear to ausculation bilaterally, normal work of breathing.  No wheezes, rales, rhonchi Heart- Regular rate and rhythm  GI- soft, non-tender, non-distended, bowel sounds present Extremities- no clubbing, cyanosis, or edema  MS- no significant deformity or atrophy Skin- warm and dry, no rash or lesion Psych- euthymic mood, full affect Neuro- strength and sensation are intact  Labs:   Lab Results  Component Value Date   WBC 5.0 04/08/2019   HGB 12.4 04/08/2019   HCT 36.6 04/08/2019   MCV 91.0 04/08/2019   PLT 202 04/08/2019   No results for input(s): NA, K, CL, CO2, BUN, CREATININE, CALCIUM, PROT, BILITOT, ALKPHOS, ALT, AST, GLUCOSE in the last 168 hours.  Invalid input(s): LABALBU    Radiology/Studies: No results found.  YHC:WCBJS rhythm, 1st degree AV block, LBBB (personally reviewed)  TELEMETRY: Zio patch recording from last night with intermittent CHB (personally reviewed)  Assessment/Plan: 1.  Intermittent complete heart block/symptomatic bradycardia On no AVN blocking agents Risks, benefits to pacemaker implantation reviewed with the patient who wishes to proceed. Nafis Farnan plan for later today with Dr Curt Bears  2.  HTN Stable No change required today  3.  Prior CVA Weylin Plagge monitor for AF with pacemaker  For questions or updates, please contact [place] Please consult [email or web address] for  contact info under Cardiology/STEMI.  Signed, Chanetta Marshall, NP 04/16/2019 12:24 PM  I have seen and examined this patient with Chanetta Marshall.  Agree with above, note added to reflect my findings.  On exam, RRR, no murmurs, lungs clear. Patient with near syncope, found to have intermittent complete AV block on monitor. Plan for pacemaker implant.  DORINNE GRAEFF has presented today for surgery, with the diagnosis of complete heart block.  The various methods of treatment have been discussed with the patient and family. After consideration of risks, benefits and  other options for treatment, the patient has consented to  Procedure(s): Pacemaker inserrtion as a surgical intervention .  Risks include but not limited to bleeding, tamponade, infection, pneumothorax, among others. The patient's history has been reviewed, patient examined, no change in status, stable for surgery.  I have reviewed the patient's chart and labs.  Questions were answered to the patient's satisfaction.    Rosalba Totty Curt Bears, MD 04/16/2019 1:55 PM      Mailee Klaas M. Rashelle Ireland MD 04/16/2019 1:54 PM

## 2019-04-16 NOTE — Plan of Care (Signed)
  Problem: Education: Goal: Knowledge of General Education information will improve Description: Including pain rating scale, medication(s)/side effects and non-pharmacologic comfort measures Outcome: Progressing   Problem: Clinical Measurements: Goal: Ability to maintain clinical measurements within normal limits will improve Outcome: Progressing   

## 2019-04-16 NOTE — Telephone Encounter (Signed)
Spoke with son. He is going to bring patient to short stay.   Chanetta Marshall, NP 04/16/2019 9:32 AM

## 2019-04-16 NOTE — Telephone Encounter (Signed)
Called by [employer] in regards to [facility] patch. Patient recently admitted with dizziness and syncope. Found to have 1st degree AVB with PVCs and in the setting of dehydration. Had Zio patch placed recently as an outpatient. Fellow called overnight with episodes of CHB, patient was called and asymptomatic per his report. Asked I follow up with this morning. Received an additional page about a 7.8 second pause at 4:32am with return to [place]. Company attempted to call patient. I attempted to call this morning the phone line is busy. Will continue to follow up. Also notify EP- Dr. Curt Bears given he recently saw as an inpatient. Have asked the company to notify of additional HB or pauses moving forward.

## 2019-04-16 NOTE — Telephone Encounter (Signed)
Patient with periods of complete heart block on Zio patch last night.  She recently was evaluated for ?symptomatic bradycardia. Discussed with Dr Curt Bears, recommends pacemaker implant today. Left message for patient to call.  Spoke with daughter in law who is going to get in touch with her son and call me back.    Chanetta Marshall, NP 04/16/2019 8:41 AM

## 2019-04-17 ENCOUNTER — Encounter (HOSPITAL_COMMUNITY): Payer: Self-pay | Admitting: Cardiology

## 2019-04-17 ENCOUNTER — Ambulatory Visit (HOSPITAL_COMMUNITY): Payer: No Typology Code available for payment source

## 2019-04-17 ENCOUNTER — Other Ambulatory Visit: Payer: Self-pay

## 2019-04-17 DIAGNOSIS — E785 Hyperlipidemia, unspecified: Secondary | ICD-10-CM | POA: Diagnosis not present

## 2019-04-17 DIAGNOSIS — I442 Atrioventricular block, complete: Secondary | ICD-10-CM | POA: Diagnosis not present

## 2019-04-17 DIAGNOSIS — I10 Essential (primary) hypertension: Secondary | ICD-10-CM | POA: Diagnosis not present

## 2019-04-17 DIAGNOSIS — Z1159 Encounter for screening for other viral diseases: Secondary | ICD-10-CM | POA: Diagnosis not present

## 2019-04-17 MED FILL — Lidocaine HCl Local Inj 1%: INTRAMUSCULAR | Qty: 60 | Status: AC

## 2019-04-17 NOTE — Discharge Summary (Addendum)
ELECTROPHYSIOLOGY PROCEDURE DISCHARGE SUMMARY    Patient ID: Bethany Nichols,  MRN: [id number], DOB/AGE: 05-11-1933 85 y.o.  Admit date: 04/16/2019 Discharge date: 04/17/2019  Primary Care Physician: Marco Collie, MD Electrophysiologist: [facility]  Primary Discharge Diagnosis:  Symptomatic intermittent complete heart block status post pacemaker implantation this admission  Secondary Discharge Diagnosis:  1.  Prior CVA 2.  HTN 3.  Hyperlipidemia 4.  Depression/anxiety   Allergies  Allergen Reactions  . Levofloxacin Rash  . Allegra [Fexofenadine] Other (See Comments)    unknown     Procedures This Admission:  1.  Implantation of a STJ dual chamber PPM on 04/16/19 by Dr Curt Bears. See op note for full details.  There were no immediate post procedure complications. 2.  CXR on 04/17/19 demonstrated no pneumothorax status post device implantation.   Brief HPI: Bethany Nichols is a 85 y.o. female was admitted 04/08/19 for weakness and bradycardia noted at home. She was discharged with a Zio monitor which demonstrated intermittent complete heart block with pauses up to 7 seconds.   The patient has had symptomatic bradycardia without reversible causes identified.  Risks, benefits, and alternatives to PPM implantation were reviewed with the patient who wished to proceed.   Hospital Course:  The patient was admitted and underwent implantation of a STJ dual chamber PPM with details as outlined above.  She  was monitored on telemetry overnight which demonstrated sinus rhythm, intermittent atrial pacing.  Left chest was without hematoma or ecchymosis.  The device was interrogated and found to be functioning normally.  CXR was obtained and demonstrated no pneumothorax status post device implantation.  Wound care, arm mobility, and restrictions were reviewed with the patient.  The patient was examined and considered stable for discharge to home.    Physical Exam: Vitals:   04/16/19  1556 04/16/19 1934 04/17/19 0011 04/17/19 0414  BP: 130/73 127/69 120/67 133/62  Pulse: 60 64 66 60  Resp: (!) 22 18 18 18   Temp: 98.1 F (36.7 C) 98 F (36.7 C) 98 F (36.7 C) 97.7 F (36.5 C)  TempSrc: Oral Oral  Oral  SpO2: 95% 97% 92% 94%  Weight: 58.3 kg   56.2 kg  Height: 5\' 1"  (1.549 m)       GEN- The patient is elderly appearing, alert and oriented x 3 today.   HEENT: normocephalic, atraumatic; sclera clear, conjunctiva pink; hearing intact; oropharynx clear; neck supple  Lungs- Clear to ausculation bilaterally, normal work of breathing.  No wheezes, rales, rhonchi Heart- Regular rate and rhythm  GI- soft, non-tender, non-distended, bowel sounds present  Extremities- no clubbing, cyanosis, or edema  MS- no significant deformity or atrophy Skin- warm and dry, no rash or lesion, left chest without hematoma/ecchymosis Psych- euthymic mood, full affect Neuro- strength and sensation are intact   Labs:   Lab Results  Component Value Date   WBC 5.0 04/08/2019   HGB 12.4 04/08/2019   HCT 36.6 04/08/2019   MCV 91.0 04/08/2019   PLT 202 04/08/2019   No results for input(s): NA, K, CL, CO2, BUN, CREATININE, CALCIUM, PROT, BILITOT, ALKPHOS, ALT, AST, GLUCOSE in the last 168 hours.  Invalid input(s): LABALBU  Discharge Medications:  Allergies as of 04/17/2019      Reactions   Levofloxacin Rash   Allegra [fexofenadine] Other (See Comments)   unknown      Medication List    TAKE these medications   acetaminophen 650 MG CR tablet Commonly known as: TYLENOL Take 650  mg by mouth every 12 (twelve) hours as needed for pain.   alendronate 70 MG tablet Commonly known as: FOSAMAX Take 70 mg by mouth once a week. Take with a full glass of water on an empty stomach.   aspirin EC 81 MG tablet Take 81 mg by mouth daily.   b complex vitamins tablet Take 1 tablet by mouth daily.   calcium carbonate 1500 (600 Ca) MG Tabs tablet Commonly known as: OSCAL Take 600 mg of  elemental calcium by mouth 2 (two) times daily with a meal.   calcium carbonate 500 MG chewable tablet Commonly known as: TUMS - dosed in mg elemental calcium Chew 1 tablet by mouth 2 (two) times daily as needed for indigestion or heartburn.   cetaphil cream Apply 1 application topically 2 (two) times daily as needed (dry skin).   Melatonin 5 MG Tabs Take 5 mg by mouth at bedtime.   Muscle Rub 10-15 % Crea Apply 1 application topically 3 (three) times daily as needed for muscle pain.   sertraline 50 MG tablet Commonly known as: ZOLOFT Take 50 mg by mouth daily.   vitamin E 1000 UNIT capsule Take 1,000 Units by mouth daily.       Disposition:   Follow-up Information    [facility] Follow up on 04/30/2019.   Specialty: Cardiology Why: at 11:30AM - virtual wound check visit  Contact information: [address], [place] [address] [phone]          Duration of Discharge Encounter: Greater than 30 minutes including physician time.  Signed, Chanetta Marshall, NP 04/17/2019 7:36 AM  I have seen and examined this patient with Chanetta Marshall.  Agree with above, note added to reflect my findings.  On exam, RRR, no murmurs, lungs clear.  She is now status post St. Jude pacemaker for intermittent complete heart block.  Device functioning appropriately.  Chest x-ray and interrogation without issue.  Plan for discharge today with follow-up in device clinic.  Garin Mata M. Jacek Colson MD 04/17/2019 8:40 AM

## 2019-04-17 NOTE — Plan of Care (Signed)
  Problem: Education: Goal: Knowledge of General Education information will improve Description: Including pain rating scale, medication(s)/side effects and non-pharmacologic comfort measures Outcome: Progressing   Problem: Health Behavior/Discharge Planning: Goal: Ability to manage health-related needs will improve Outcome: Progressing   Problem: Clinical Measurements: Goal: Ability to maintain clinical measurements within normal limits will improve Outcome: Progressing Goal: Will remain free from infection Outcome: Progressing   Problem: Skin Integrity: Goal: Risk for impaired skin integrity will decrease Outcome: Progressing   

## 2019-04-17 NOTE — Discharge Instructions (Signed)
° ° °  Supplemental Discharge Instructions for  Pacemaker/Defibrillator Patients  Activity No heavy lifting or vigorous activity with your left/right arm for 6 to 8 weeks.  Do not raise your left/right arm above your head for one week.  Gradually raise your affected arm as drawn below.           _              04/21/19                   04/22/19                   04/23/19                   6/26/20_         WOUND CARE - Keep the wound area clean and dry.  Do not get this area wet for one week. No showers for one week; you may shower on 04/24/19    . - The tape/steri-strips on your wound will fall off; do not pull them off.  No bandage is needed on the site.  DO  NOT apply any creams, oils, or ointments to the wound area. - If you notice any drainage or discharge from the wound, any swelling or bruising at the site, or you develop a fever > 101? F after you are discharged home, call the office at once.  Special Instructions - You are still able to use cellular telephones; use the ear opposite the side where you have your pacemaker/defibrillator.  Avoid carrying your cellular phone near your device. - When traveling through airports, show security personnel your identification card to avoid being screened in the metal detectors.  Ask the security personnel to use the hand wand. - Avoid arc welding equipment, TENS units (transcutaneous nerve stimulators).  Call the office for questions about other devices. - Avoid electrical appliances that are in poor condition or are not properly grounded. - Microwave ovens are safe to be near or to operate.

## 2019-04-29 ENCOUNTER — Telehealth: Payer: Self-pay | Admitting: Cardiology

## 2019-04-29 NOTE — Telephone Encounter (Signed)

## 2019-04-30 ENCOUNTER — Telehealth: Payer: Self-pay | Admitting: Cardiology

## 2019-04-30 ENCOUNTER — Telehealth: Payer: Self-pay | Admitting: *Deleted

## 2019-04-30 ENCOUNTER — Telehealth (INDEPENDENT_AMBULATORY_CARE_PROVIDER_SITE_OTHER): Payer: Medicare (Managed Care) | Admitting: *Deleted

## 2019-04-30 ENCOUNTER — Other Ambulatory Visit: Payer: Self-pay

## 2019-04-30 ENCOUNTER — Telehealth: Payer: Self-pay

## 2019-04-30 DIAGNOSIS — R001 Bradycardia, unspecified: Secondary | ICD-10-CM | POA: Diagnosis not present

## 2019-04-30 DIAGNOSIS — I442 Atrioventricular block, complete: Secondary | ICD-10-CM

## 2019-04-30 LAB — CUP PACEART REMOTE DEVICE CHECK
Battery Remaining Longevity: 96 mo
Battery Remaining Percentage: 95.5 %
Battery Voltage: 3.05 V
Brady Statistic AP VP Percent: 1 %
Brady Statistic AP VS Percent: 32 %
Brady Statistic AS VP Percent: 1 %
Brady Statistic AS VS Percent: 67 %
Brady Statistic RA Percent Paced: 28 %
Brady Statistic RV Percent Paced: 1 %
Date Time Interrogation Session: 20200702132003
Implantable Lead Implant Date: 20200618
Implantable Lead Implant Date: 20200618
Implantable Lead Location: 753859
Implantable Lead Location: 753860
Implantable Pulse Generator Implant Date: 20200618
Lead Channel Impedance Value: 450 Ohm
Lead Channel Impedance Value: 600 Ohm
Lead Channel Pacing Threshold Amplitude: 0.5 V
Lead Channel Pacing Threshold Amplitude: 0.5 V
Lead Channel Pacing Threshold Pulse Width: 0.4 ms
Lead Channel Pacing Threshold Pulse Width: 0.4 ms
Lead Channel Sensing Intrinsic Amplitude: 1 mV
Lead Channel Sensing Intrinsic Amplitude: 9.5 mV
Lead Channel Setting Pacing Amplitude: 3.5 V
Lead Channel Setting Pacing Amplitude: 3.5 V
Lead Channel Setting Pacing Pulse Width: 0.4 ms
Lead Channel Setting Sensing Sensitivity: 2 mV
Pulse Gen Model: 2272
Pulse Gen Serial Number: 3310664

## 2019-04-30 NOTE — Telephone Encounter (Signed)
Pt transmission received

## 2019-04-30 NOTE — Telephone Encounter (Signed)
Spoke with patient regarding upcoming virtual visit. She requests I call her daughter in law, Crystal, to confirm video method. Spoke with Crystal, who will assist with visit and use her daughter's iPhone to FaceTime ([phone]). She denies additional questions or concerns at this time.

## 2019-04-30 NOTE — Telephone Encounter (Signed)
Notified remote transmission received.

## 2019-04-30 NOTE — Progress Notes (Signed)
Virtual ppm wound check visit. Steri-strips removed by family. By video and pt description, wound edges approximated, no redness, drainage or edema. Remote transmission sent with assistance. automatic impedance and sensing trends stable. Threshold testing not programmed to be automatic. No atrial or ventricular arrhythmias.Next remote scheduled for 07/17/19 F/U with WC in 91 days.

## 2019-05-08 ENCOUNTER — Encounter (HOSPITAL_COMMUNITY): Payer: Self-pay | Admitting: Cardiology

## 2019-05-15 ENCOUNTER — Telehealth: Payer: Medicare (Managed Care) | Admitting: Cardiology

## 2019-07-16 ENCOUNTER — Encounter: Payer: Medicare (Managed Care) | Admitting: Cardiology

## 2019-07-17 ENCOUNTER — Ambulatory Visit (INDEPENDENT_AMBULATORY_CARE_PROVIDER_SITE_OTHER): Payer: PRIVATE HEALTH INSURANCE | Admitting: *Deleted

## 2019-07-17 DIAGNOSIS — I442 Atrioventricular block, complete: Secondary | ICD-10-CM

## 2019-07-17 LAB — CUP PACEART REMOTE DEVICE CHECK
Battery Remaining Longevity: 98 mo
Battery Remaining Percentage: 95.5 %
Battery Voltage: 3.02 V
Brady Statistic AP VP Percent: 1 %
Brady Statistic AP VS Percent: 29 %
Brady Statistic AS VP Percent: 1 %
Brady Statistic AS VS Percent: 70 %
Brady Statistic RA Percent Paced: 28 %
Brady Statistic RV Percent Paced: 1 %
Date Time Interrogation Session: 20200918060016
Implantable Lead Implant Date: 20200618
Implantable Lead Implant Date: 20200618
Implantable Lead Location: 753859
Implantable Lead Location: 753860
Implantable Pulse Generator Implant Date: 20200618
Lead Channel Impedance Value: 510 Ohm
Lead Channel Impedance Value: 590 Ohm
Lead Channel Pacing Threshold Amplitude: 0.5 V
Lead Channel Pacing Threshold Amplitude: 0.5 V
Lead Channel Pacing Threshold Pulse Width: 0.4 ms
Lead Channel Pacing Threshold Pulse Width: 0.4 ms
Lead Channel Sensing Intrinsic Amplitude: 1.8 mV
Lead Channel Sensing Intrinsic Amplitude: 10.2 mV
Lead Channel Setting Pacing Amplitude: 3.5 V
Lead Channel Setting Pacing Amplitude: 3.5 V
Lead Channel Setting Pacing Pulse Width: 0.4 ms
Lead Channel Setting Sensing Sensitivity: 2 mV
Pulse Gen Model: 2272
Pulse Gen Serial Number: 3310664

## 2019-07-20 ENCOUNTER — Other Ambulatory Visit: Payer: Self-pay

## 2019-07-20 ENCOUNTER — Encounter: Payer: Self-pay | Admitting: Cardiology

## 2019-07-20 ENCOUNTER — Ambulatory Visit (INDEPENDENT_AMBULATORY_CARE_PROVIDER_SITE_OTHER): Payer: Medicare (Managed Care) | Admitting: Cardiology

## 2019-07-20 VITALS — BP 112/76 | HR 62 | Ht 61.0 in | Wt 133.6 lb

## 2019-07-20 DIAGNOSIS — I442 Atrioventricular block, complete: Secondary | ICD-10-CM | POA: Diagnosis not present

## 2019-07-20 LAB — CUP PACEART INCLINIC DEVICE CHECK
Battery Remaining Longevity: 138 mo
Battery Voltage: 3.02 V
Brady Statistic RA Percent Paced: 28 %
Brady Statistic RV Percent Paced: 0.41 %
Date Time Interrogation Session: 20200921160559
Implantable Lead Implant Date: 20200618
Implantable Lead Implant Date: 20200618
Implantable Lead Location: 753859
Implantable Lead Location: 753860
Implantable Pulse Generator Implant Date: 20200618
Lead Channel Impedance Value: 462.5 Ohm
Lead Channel Impedance Value: 587.5 Ohm
Lead Channel Pacing Threshold Amplitude: 0.5 V
Lead Channel Pacing Threshold Amplitude: 1 V
Lead Channel Pacing Threshold Pulse Width: 0.4 ms
Lead Channel Pacing Threshold Pulse Width: 0.4 ms
Lead Channel Sensing Intrinsic Amplitude: 1 mV
Lead Channel Sensing Intrinsic Amplitude: 9.5 mV
Lead Channel Setting Pacing Amplitude: 2 V
Lead Channel Setting Pacing Amplitude: 2.5 V
Lead Channel Setting Pacing Pulse Width: 0.4 ms
Lead Channel Setting Sensing Sensitivity: 2 mV
Pulse Gen Model: 2272
Pulse Gen Serial Number: 3310664

## 2019-07-20 NOTE — Progress Notes (Signed)
Electrophysiology Office Note   Date:  07/20/2019   ID:  Margaruite, Weidler 10/02/1933, MRN [id number]  PCP:  Marco Collie, MD  Cardiologist:   Primary Electrophysiologist:  Shondrea Steinert Meredith Leeds, MD    Chief Complaint: pacemaker   History of Present Illness: Bethany Nichols is a 85 y.o. female who is being seen today for the evaluation of pacemaker at the request of Marco Collie, MD. Presenting today for electrophysiology evaluation.  He has a history of hypertension, hyperlipidemia, CVA.  She initially presented to the hospital 04/08/2019 with weakness and bradycardia.  She was discharged with a ZIO monitor that showed 7-second pauses.  She thus had a Saint Jude dual-chamber pacemaker implanted 04/16/2019.  Today, she denies symptoms of palpitations, chest pain, shortness of breath, orthopnea, PND, lower extremity edema, claudication, dizziness, presyncope, syncope, bleeding, or neurologic sequela. The patient is tolerating medications without difficulties.    Past Medical History:  Diagnosis Date  . Anxiety   . Complete heart block ([place])   . CVA (cerebral vascular accident) ([place])   . Depression   . Hyperlipidemia   . Hypertension    Past Surgical History:  Procedure Laterality Date  . PACEMAKER IMPLANT N/A 04/16/2019   Procedure: PACEMAKER IMPLANT;  Surgeon: Constance Haw, MD;  Location: [facility];  Service: Cardiovascular;  Laterality: N/A;     Current Outpatient Medications  Medication Sig Dispense Refill  . acetaminophen (TYLENOL) 650 MG CR tablet Take 650 mg by mouth every 12 (twelve) hours as needed for pain.    Marland Kitchen alendronate (FOSAMAX) 70 MG tablet Take 70 mg by mouth once a week. Take with a full glass of water on an empty stomach.    Marland Kitchen aspirin EC 81 MG tablet Take 81 mg by mouth daily.    Marland Kitchen b complex vitamins tablet Take 1 tablet by mouth daily.    . calcium carbonate (OSCAL) 1500 (600 Ca) MG TABS tablet Take 600 mg of elemental calcium by  mouth 2 (two) times daily with a meal.    . calcium carbonate (TUMS - DOSED IN MG ELEMENTAL CALCIUM) 500 MG chewable tablet Chew 1 tablet by mouth 2 (two) times daily as needed for indigestion or heartburn.    . cetaphil (CETAPHIL) cream Apply 1 application topically 2 (two) times daily as needed (dry skin).    . Melatonin 5 MG TABS Take 5 mg by mouth at bedtime.    . Menthol-Methyl Salicylate (MUSCLE RUB) 10-15 % CREA Apply 1 application topically 3 (three) times daily as needed for muscle pain.    Marland Kitchen sertraline (ZOLOFT) 50 MG tablet Take 50 mg by mouth daily.    . vitamin E 1000 UNIT capsule Take 1,000 Units by mouth daily.     No current facility-administered medications for this visit.     Allergies:   Levofloxacin and Allegra [fexofenadine]   Social History:  The patient  reports that she has never smoked. She has never used smokeless tobacco.   Family History:  The patient's family history includes Breast cancer in her mother.    ROS:  Please see the history of present illness.   Otherwise, review of systems is positive for none.   All other systems are reviewed and negative.    PHYSICAL EXAM: VS:  BP 112/76   Pulse 62   Ht 5\' 1"  (1.549 m)   Wt 133 lb 9.6 oz (60.6 kg)   SpO2 99%   BMI 25.24 kg/m  ,  BMI Body mass index is 25.24 kg/m. GEN: Well nourished, well developed, in no acute distress  HEENT: normal  Neck: no JVD, carotid bruits, or masses Cardiac: RRR; no murmurs, rubs, or gallops,no edema  Respiratory:  clear to auscultation bilaterally, normal work of breathing GI: soft, nontender, nondistended, + BS MS: no deformity or atrophy  Skin: warm and dry, device pocket is well healed Neuro:  Strength and sensation are intact Psych: euthymic mood, full affect  EKG:  EKG is ordered today. Personal review of the ekg ordered shows sinus rhythm, first-degree AV block, rate 62  Device interrogation is reviewed today in detail.  See PaceArt for details.   Recent Labs:  04/08/2019: BUN 12; Creatinine, Ser 0.73; Hemoglobin 12.4; Platelets 202; Potassium 4.0; Sodium 137; TSH 3.365    Lipid Panel  No results found for: CHOL, TRIG, HDL, CHOLHDL, VLDL, LDLCALC, LDLDIRECT   Wt Readings from Last 3 Encounters:  07/20/19 133 lb 9.6 oz (60.6 kg)  04/17/19 124 lb (56.2 kg)  04/08/19 132 lb 3.2 oz (60 kg)      Other studies Reviewed: Additional studies/ records that were reviewed today include: TTE 04/09/19 Review of the above records today demonstrates:   1. The left ventricle has normal systolic function with an ejection fraction of 60-65%. The cavity size was normal. Left ventricular diastolic Doppler parameters are consistent with impaired relaxation.  2. The right ventricle has normal systolic function. The cavity was normal. There is no increase in right ventricular wall thickness. Right ventricular systolic pressure is mildly elevated with an estimated pressure of 38.7 mmHg.  3. Mild sclerosis of the aortic valve. Aortic valve regurgitation is mild by color flow Doppler.  4. The inferior vena cava was dilated in size with >50% respiratory variability.   ASSESSMENT AND PLAN:  1.  Intermittent complete heart block: Status post Saint Jude dual-chamber pacemaker implanted 04/16/2019.  Device functioning appropriately.  2.  Hypertension:well controlled   Current medicines are reviewed at length with the patient today.   The patient does not have concerns regarding her medicines.  The following changes were made today:  none  Labs/ tests ordered today include:  Orders Placed This Encounter  Procedures  . EKG 12-Lead     Disposition:   FU with Rieley Khalsa 9 months  Signed, Zair Borawski Meredith Leeds, MD  07/20/2019 2:44 PM     [place] [address] [place]  [address] [phone] (office) [phone] (fax)

## 2019-07-20 NOTE — Patient Instructions (Addendum)
Medication Instructions:  Your physician recommends that you continue on your current medications as directed. Please refer to the Current Medication list given to you today.  *If you need a refill on your cardiac medications before your next appointment, please call your pharmacy*  Labwork: None ordered If you have labs (blood work) drawn today and your tests are completely normal, you will receive your results only by:  [place] (if you have MyChart) OR  A paper copy in the mail If you have any lab test that is abnormal or we need to change your treatment, we will call you to review the results.  Testing/Procedures: None ordered  Follow-Up: Remote monitoring is used to monitor your Pacemaker or ICD from home. This monitoring reduces the number of office visits required to check your device to one time per year. It allows [place] to keep an eye on the functioning of your device to ensure it is working properly. You are scheduled for a device check from home on 10/26/19. You may send your transmission at any time that day. If you have a wireless device, the transmission will be sent automatically. After your physician reviews your transmission, you will receive a postcard with your next transmission date.  Your physician wants you to follow-up in: 9 months with Dr. Curt Bears in [place]. You will receive a reminder letter in the mail two months in advance. If you don't receive a letter, please call our office to schedule the follow-up appointment.   Thank you for choosing [facility]!!   Trinidad Curet, RN [phone]  Any Other Special Instructions Will Be Listed Below (If Applicable).

## 2019-07-20 NOTE — Progress Notes (Signed)
Remote pacemaker transmission.   

## 2019-07-21 NOTE — Addendum Note (Signed)
Addended by: Tiajuana Amass on: 07/21/2019 03:21 PM   Modules accepted: Level of Service

## 2019-10-26 ENCOUNTER — Ambulatory Visit (INDEPENDENT_AMBULATORY_CARE_PROVIDER_SITE_OTHER): Payer: No Typology Code available for payment source | Admitting: *Deleted

## 2019-10-26 DIAGNOSIS — I442 Atrioventricular block, complete: Secondary | ICD-10-CM

## 2019-10-26 LAB — CUP PACEART REMOTE DEVICE CHECK
Battery Remaining Longevity: 125 mo
Battery Remaining Percentage: 95.5 %
Battery Voltage: 3.02 V
Brady Statistic AP VP Percent: 1 %
Brady Statistic AP VS Percent: 29 %
Brady Statistic AS VP Percent: 1 %
Brady Statistic AS VS Percent: 70 %
Brady Statistic RA Percent Paced: 28 %
Brady Statistic RV Percent Paced: 1 %
Date Time Interrogation Session: 20201228020019
Implantable Lead Implant Date: 20200618
Implantable Lead Implant Date: 20200618
Implantable Lead Location: 753859
Implantable Lead Location: 753860
Implantable Pulse Generator Implant Date: 20200618
Lead Channel Impedance Value: 510 Ohm
Lead Channel Impedance Value: 600 Ohm
Lead Channel Pacing Threshold Amplitude: 0.5 V
Lead Channel Pacing Threshold Amplitude: 1 V
Lead Channel Pacing Threshold Pulse Width: 0.4 ms
Lead Channel Pacing Threshold Pulse Width: 0.4 ms
Lead Channel Sensing Intrinsic Amplitude: 1 mV
Lead Channel Sensing Intrinsic Amplitude: 9 mV
Lead Channel Setting Pacing Amplitude: 2 V
Lead Channel Setting Pacing Amplitude: 2.5 V
Lead Channel Setting Pacing Pulse Width: 0.4 ms
Lead Channel Setting Sensing Sensitivity: 2 mV
Pulse Gen Model: 2272
Pulse Gen Serial Number: 3310664

## 2020-01-25 ENCOUNTER — Ambulatory Visit (INDEPENDENT_AMBULATORY_CARE_PROVIDER_SITE_OTHER): Payer: No Typology Code available for payment source | Admitting: *Deleted

## 2020-01-25 DIAGNOSIS — I442 Atrioventricular block, complete: Secondary | ICD-10-CM

## 2020-01-25 LAB — CUP PACEART REMOTE DEVICE CHECK
Battery Remaining Longevity: 124 mo
Battery Remaining Percentage: 95.5 %
Battery Voltage: 3.02 V
Brady Statistic AP VP Percent: 1 %
Brady Statistic AP VS Percent: 25 %
Brady Statistic AS VP Percent: 1 %
Brady Statistic AS VS Percent: 74 %
Brady Statistic RA Percent Paced: 24 %
Brady Statistic RV Percent Paced: 1 %
Date Time Interrogation Session: 20210329020012
Implantable Lead Implant Date: 20200618
Implantable Lead Implant Date: 20200618
Implantable Lead Location: 753859
Implantable Lead Location: 753860
Implantable Pulse Generator Implant Date: 20200618
Lead Channel Impedance Value: 540 Ohm
Lead Channel Impedance Value: 630 Ohm
Lead Channel Pacing Threshold Amplitude: 0.5 V
Lead Channel Pacing Threshold Amplitude: 1 V
Lead Channel Pacing Threshold Pulse Width: 0.4 ms
Lead Channel Pacing Threshold Pulse Width: 0.4 ms
Lead Channel Sensing Intrinsic Amplitude: 0.9 mV
Lead Channel Sensing Intrinsic Amplitude: 9.7 mV
Lead Channel Setting Pacing Amplitude: 2 V
Lead Channel Setting Pacing Amplitude: 2.5 V
Lead Channel Setting Pacing Pulse Width: 0.4 ms
Lead Channel Setting Sensing Sensitivity: 2 mV
Pulse Gen Model: 2272
Pulse Gen Serial Number: 3310664

## 2020-01-25 NOTE — Progress Notes (Signed)
PPM Remote  

## 2020-04-25 ENCOUNTER — Ambulatory Visit (INDEPENDENT_AMBULATORY_CARE_PROVIDER_SITE_OTHER): Payer: No Typology Code available for payment source | Admitting: *Deleted

## 2020-04-25 DIAGNOSIS — I442 Atrioventricular block, complete: Secondary | ICD-10-CM | POA: Diagnosis not present

## 2020-04-26 LAB — CUP PACEART REMOTE DEVICE CHECK
Battery Remaining Longevity: 121 mo
Battery Remaining Percentage: 95.5 %
Battery Voltage: 3.02 V
Brady Statistic AP VP Percent: 1 %
Brady Statistic AP VS Percent: 25 %
Brady Statistic AS VP Percent: 1 %
Brady Statistic AS VS Percent: 74 %
Brady Statistic RA Percent Paced: 24 %
Brady Statistic RV Percent Paced: 1 %
Date Time Interrogation Session: 20210628020014
Implantable Lead Implant Date: 20200618
Implantable Lead Implant Date: 20200618
Implantable Lead Location: 753859
Implantable Lead Location: 753860
Implantable Pulse Generator Implant Date: 20200618
Lead Channel Impedance Value: 490 Ohm
Lead Channel Impedance Value: 560 Ohm
Lead Channel Pacing Threshold Amplitude: 0.5 V
Lead Channel Pacing Threshold Amplitude: 1 V
Lead Channel Pacing Threshold Pulse Width: 0.4 ms
Lead Channel Pacing Threshold Pulse Width: 0.4 ms
Lead Channel Sensing Intrinsic Amplitude: 1 mV
Lead Channel Sensing Intrinsic Amplitude: 7.2 mV
Lead Channel Setting Pacing Amplitude: 2 V
Lead Channel Setting Pacing Amplitude: 2.5 V
Lead Channel Setting Pacing Pulse Width: 0.4 ms
Lead Channel Setting Sensing Sensitivity: 2 mV
Pulse Gen Model: 2272
Pulse Gen Serial Number: 3310664

## 2020-04-27 NOTE — Progress Notes (Signed)
Remote pacemaker transmission.   

## 2020-06-13 ENCOUNTER — Other Ambulatory Visit: Payer: Self-pay

## 2020-06-13 ENCOUNTER — Ambulatory Visit (INDEPENDENT_AMBULATORY_CARE_PROVIDER_SITE_OTHER): Payer: No Typology Code available for payment source | Admitting: Cardiology

## 2020-06-13 ENCOUNTER — Encounter: Payer: Self-pay | Admitting: Cardiology

## 2020-06-13 VITALS — BP 112/60 | HR 63 | Ht 61.0 in | Wt 129.0 lb

## 2020-06-13 DIAGNOSIS — I442 Atrioventricular block, complete: Secondary | ICD-10-CM | POA: Diagnosis not present

## 2020-06-13 DIAGNOSIS — Z950 Presence of cardiac pacemaker: Secondary | ICD-10-CM

## 2020-06-13 LAB — CUP PACEART INCLINIC DEVICE CHECK
Battery Remaining Longevity: 141 mo
Battery Voltage: 3.02 V
Brady Statistic RA Percent Paced: 25 %
Brady Statistic RV Percent Paced: 0.35 %
Date Time Interrogation Session: 20210816105059
Implantable Lead Implant Date: 20200618
Implantable Lead Implant Date: 20200618
Implantable Lead Location: 753859
Implantable Lead Location: 753860
Implantable Pulse Generator Implant Date: 20200618
Lead Channel Impedance Value: 487.5 Ohm
Lead Channel Impedance Value: 587.5 Ohm
Lead Channel Pacing Threshold Amplitude: 0.5 V
Lead Channel Pacing Threshold Amplitude: 1 V
Lead Channel Pacing Threshold Pulse Width: 0.4 ms
Lead Channel Pacing Threshold Pulse Width: 0.4 ms
Lead Channel Sensing Intrinsic Amplitude: 0.7 mV
Lead Channel Sensing Intrinsic Amplitude: 7.6 mV
Lead Channel Setting Pacing Amplitude: 2 V
Lead Channel Setting Pacing Amplitude: 2.5 V
Lead Channel Setting Pacing Pulse Width: 0.4 ms
Lead Channel Setting Sensing Sensitivity: 2 mV
Pulse Gen Model: 2272
Pulse Gen Serial Number: 3310664

## 2020-06-13 NOTE — Progress Notes (Signed)
Electrophysiology Office Note   Date:  06/13/2020   ID:  Bethany Nichols, DOB [birth year], MRN [id number]  PCP:  Marco Collie, MD  Cardiologist:   Primary Electrophysiologist:  Yatziri Wainwright Meredith Leeds, MD    Chief Complaint: pacemaker   History of Present Illness: Bethany Nichols is a 86 y.o. female who is being seen today for the evaluation of pacemaker at the request of Marco Collie, MD. Presenting today for electrophysiology evaluation.  She has a history significant for hypertension, hyperlipidemia, and CVA.  She initially presented to the hospital 04/08/2019 with weakness and bradycardia.  She was discharged with a ZIO monitor that showed 7-second pauses.  She is now status post Garden dual-chamber pacemaker implanted 04/16/2019.    Today, denies symptoms of palpitations, chest pain, shortness of breath, orthopnea, PND, lower extremity edema, claudication, dizziness, presyncope, syncope, bleeding, or neurologic sequela. The patient is tolerating medications without difficulties.  Overall she is doing well.  She has no chest pain or shortness of breath.  Is able to all of her daily activities.  She continues to remain quarantined due to the [place] outbreak.  Aside from that she has no major complaints.   Past Medical History:  Diagnosis Date  . Anxiety   . Complete heart block ([place])   . CVA (cerebral vascular accident) ([place])   . Depression   . Hyperlipidemia   . Hypertension    Past Surgical History:  Procedure Laterality Date  . PACEMAKER IMPLANT N/A 04/16/2019   Procedure: PACEMAKER IMPLANT;  Surgeon: Constance Haw, MD;  Location: [place] [facility];  Service: Cardiovascular;  Laterality: N/A;     Current Outpatient Medications  Medication Sig Dispense Refill  . acetaminophen (TYLENOL) 650 MG CR tablet Take 650 mg by mouth every 12 (twelve) hours as needed for pain.    Marland Kitchen alendronate (FOSAMAX) 70 MG tablet Take 70 mg by mouth once a week. Take with a full  glass of water on an empty stomach.    Marland Kitchen aspirin EC 81 MG tablet Take 81 mg by mouth daily.    Marland Kitchen b complex vitamins tablet Take 1 tablet by mouth daily.    . calcium carbonate (OSCAL) 1500 (600 Ca) MG TABS tablet Take 600 mg of elemental calcium by mouth 2 (two) times daily with a meal.    . calcium carbonate (TUMS - DOSED IN MG ELEMENTAL CALCIUM) 500 MG chewable tablet Chew 1 tablet by mouth 2 (two) times daily as needed for indigestion or heartburn.    . cetaphil (CETAPHIL) cream Apply 1 application topically 2 (two) times daily as needed (dry skin).    . Melatonin 5 MG TABS Take 5 mg by mouth at bedtime.    . Menthol-Methyl Salicylate (MUSCLE RUB) 10-15 % CREA Apply 1 application topically 3 (three) times daily as needed for muscle pain.    Marland Kitchen sertraline (ZOLOFT) 50 MG tablet Take 50 mg by mouth daily.    . vitamin E 1000 UNIT capsule Take 1,000 Units by mouth daily.     No current facility-administered medications for this visit.    Allergies:   Levofloxacin and Allegra [fexofenadine]   Social History:  The patient  reports that she has never smoked. She has never used smokeless tobacco.   Family History:  The patient's family history includes Breast cancer in her mother.    ROS:  Please see the history of present illness.   Otherwise, review of systems is positive for none.  All other systems are reviewed and negative.   PHYSICAL EXAM: VS:  BP 112/60   Pulse 63   Ht 5\' 1"  (1.549 m)   Wt 129 lb (58.5 kg)   SpO2 98%   BMI 24.37 kg/m  , BMI Body mass index is 24.37 kg/m. GEN: Well nourished, well developed, in no acute distress  HEENT: normal  Neck: no JVD, carotid bruits, or masses Cardiac: RRR; no murmurs, rubs, or gallops,no edema  Respiratory:  clear to auscultation bilaterally, normal work of breathing GI: soft, nontender, nondistended, + BS MS: no deformity or atrophy  Skin: warm and dry, device site well healed Neuro:  Strength and sensation are intact Psych:  euthymic mood, full affect  EKG:  EKG is ordered today. Personal review of the ekg ordered shows A paced, LBBB  Personal review of the device interrogation today. Results in [place]: No results found for requested labs within last 8760 hours.    Lipid Panel  No results found for: CHOL, TRIG, HDL, CHOLHDL, VLDL, LDLCALC, LDLDIRECT   Wt Readings from Last 3 Encounters:  06/13/20 129 lb (58.5 kg)  07/20/19 133 lb 9.6 oz (60.6 kg)  04/17/19 124 lb (56.2 kg)      Other studies Reviewed: Additional studies/ records that were reviewed today include: TTE 04/09/19 Review of the above records today demonstrates:   1. The left ventricle has normal systolic function with an ejection fraction of 60-65%. The cavity size was normal. Left ventricular diastolic Doppler parameters are consistent with impaired relaxation.  2. The right ventricle has normal systolic function. The cavity was normal. There is no increase in right ventricular wall thickness. Right ventricular systolic pressure is mildly elevated with an estimated pressure of 38.7 mmHg.  3. Mild sclerosis of the aortic valve. Aortic valve regurgitation is mild by color flow Doppler.  4. The inferior vena cava was dilated in size with >50% respiratory variability.   ASSESSMENT AND PLAN:  1.  Intermittent complete heart block: Status post Saint Jude dual-chamber pacemaker implanted 04/16/2019.  Device functioning appropriately.  No changes at this time.    2.  Hypertension: Currently well controlled   Current medicines are reviewed at length with the patient today.   The patient does not have concerns regarding her medicines.  The following changes were made today:  none  Labs/ tests ordered today include:  Orders Placed This Encounter  Procedures  . EKG 12-Lead     Disposition:   FU with Reanna Scoggin 12 months  Signed, Shery Wauneka Meredith Leeds, MD  06/13/2020 10:55 AM     [facility] [address] [place] [address] [phone] (office) [phone] (fax)

## 2020-06-13 NOTE — Patient Instructions (Signed)
Medication Instructions:  Your physician recommends that you continue on your current medications as directed. Please refer to the Current Medication list given to you today.  *If you need a refill on your cardiac medications before your next appointment, please call your pharmacy*   Lab Work: None ordered If you have labs (blood work) drawn today and your tests are completely normal, you will receive your results only by:  [place] (if you have MyChart) OR  A paper copy in the mail If you have any lab test that is abnormal or we need to change your treatment, we will call you to review the results.   Testing/Procedures: None ordered   Follow-Up: Remote monitoring is used to monitor your Pacemaker of ICD from home. This monitoring reduces the number of office visits required to check your device to one time per year. It allows [place] to keep an eye on the functioning of your device to ensure it is working properly. You are scheduled for a device check from home on 07/25/2020. You may send your transmission at any time that day. If you have a wireless device, the transmission will be sent automatically. After your physician reviews your transmission, you will receive a postcard with your next transmission date.  At [facility], you and your health needs are our priority.  As part of our continuing mission to provide you with exceptional heart care, we have created designated Provider Care Teams.  These Care Teams include your primary Cardiologist (physician) and Advanced Practice Providers (APPs -  Physician Assistants and Nurse Practitioners) who all work together to provide you with the care you need, when you need it.  We recommend signing up for the patient portal called "MyChart".  Sign up information is provided on this After Visit Summary.  MyChart is used to connect with patients for Virtual Visits (Telemedicine).  Patients are able to view lab/test results, encounter notes,  upcoming appointments, etc.  Non-urgent messages can be sent to your provider as well.   To learn more about what you can do with MyChart, go to [email or web address].    Your next appointment:   1 year(s)  The format for your next appointment:   In Person  Provider:   Allegra Lai, MD   Thank you for choosing [facility]!!   Trinidad Curet, RN [phone]    Other Instructions

## 2020-07-25 ENCOUNTER — Ambulatory Visit (INDEPENDENT_AMBULATORY_CARE_PROVIDER_SITE_OTHER): Payer: No Typology Code available for payment source | Admitting: Emergency Medicine

## 2020-07-25 DIAGNOSIS — I442 Atrioventricular block, complete: Secondary | ICD-10-CM | POA: Diagnosis not present

## 2020-07-26 LAB — CUP PACEART REMOTE DEVICE CHECK
Battery Remaining Longevity: 130 mo
Battery Remaining Percentage: 95.5 %
Battery Voltage: 3.02 V
Brady Statistic AP VP Percent: 1 %
Brady Statistic AP VS Percent: 27 %
Brady Statistic AS VP Percent: 1 %
Brady Statistic AS VS Percent: 72 %
Brady Statistic RA Percent Paced: 26 %
Brady Statistic RV Percent Paced: 1 %
Date Time Interrogation Session: 20210927020831
Implantable Lead Implant Date: 20200618
Implantable Lead Implant Date: 20200618
Implantable Lead Location: 753859
Implantable Lead Location: 753860
Implantable Pulse Generator Implant Date: 20200618
Lead Channel Impedance Value: 490 Ohm
Lead Channel Impedance Value: 590 Ohm
Lead Channel Pacing Threshold Amplitude: 0.5 V
Lead Channel Pacing Threshold Amplitude: 1 V
Lead Channel Pacing Threshold Pulse Width: 0.4 ms
Lead Channel Pacing Threshold Pulse Width: 0.4 ms
Lead Channel Sensing Intrinsic Amplitude: 0.9 mV
Lead Channel Sensing Intrinsic Amplitude: 7 mV
Lead Channel Setting Pacing Amplitude: 2 V
Lead Channel Setting Pacing Amplitude: 2.5 V
Lead Channel Setting Pacing Pulse Width: 0.4 ms
Lead Channel Setting Sensing Sensitivity: 2 mV
Pulse Gen Model: 2272
Pulse Gen Serial Number: 3310664

## 2020-07-27 NOTE — Progress Notes (Signed)
Remote pacemaker transmission.   

## 2020-10-24 ENCOUNTER — Ambulatory Visit (INDEPENDENT_AMBULATORY_CARE_PROVIDER_SITE_OTHER): Payer: No Typology Code available for payment source

## 2020-10-24 DIAGNOSIS — I442 Atrioventricular block, complete: Secondary | ICD-10-CM

## 2020-10-24 LAB — CUP PACEART REMOTE DEVICE CHECK
Battery Remaining Longevity: 129 mo
Battery Remaining Percentage: 95.5 %
Battery Voltage: 3.02 V
Brady Statistic AP VP Percent: 1 %
Brady Statistic AP VS Percent: 26 %
Brady Statistic AS VP Percent: 1 %
Brady Statistic AS VS Percent: 73 %
Brady Statistic RA Percent Paced: 25 %
Brady Statistic RV Percent Paced: 1 %
Date Time Interrogation Session: 20211227020014
Implantable Lead Implant Date: 20200618
Implantable Lead Implant Date: 20200618
Implantable Lead Location: 753859
Implantable Lead Location: 753860
Implantable Pulse Generator Implant Date: 20200618
Lead Channel Impedance Value: 490 Ohm
Lead Channel Impedance Value: 560 Ohm
Lead Channel Pacing Threshold Amplitude: 0.5 V
Lead Channel Pacing Threshold Amplitude: 1 V
Lead Channel Pacing Threshold Pulse Width: 0.4 ms
Lead Channel Pacing Threshold Pulse Width: 0.4 ms
Lead Channel Sensing Intrinsic Amplitude: 1 mV
Lead Channel Sensing Intrinsic Amplitude: 8.6 mV
Lead Channel Setting Pacing Amplitude: 2 V
Lead Channel Setting Pacing Amplitude: 2.5 V
Lead Channel Setting Pacing Pulse Width: 0.4 ms
Lead Channel Setting Sensing Sensitivity: 2 mV
Pulse Gen Model: 2272
Pulse Gen Serial Number: 3310664

## 2020-11-07 NOTE — Progress Notes (Signed)
Remote pacemaker transmission.   

## 2021-01-23 ENCOUNTER — Ambulatory Visit (INDEPENDENT_AMBULATORY_CARE_PROVIDER_SITE_OTHER): Payer: No Typology Code available for payment source

## 2021-01-23 DIAGNOSIS — I442 Atrioventricular block, complete: Secondary | ICD-10-CM | POA: Diagnosis not present

## 2021-01-24 LAB — CUP PACEART REMOTE DEVICE CHECK
Battery Remaining Longevity: 128 mo
Battery Remaining Percentage: 95.5 %
Battery Voltage: 3.01 V
Brady Statistic AP VP Percent: 1 %
Brady Statistic AP VS Percent: 23 %
Brady Statistic AS VP Percent: 1 %
Brady Statistic AS VS Percent: 76 %
Brady Statistic RA Percent Paced: 22 %
Brady Statistic RV Percent Paced: 1 %
Date Time Interrogation Session: 20220328020014
Implantable Lead Implant Date: 20200618
Implantable Lead Implant Date: 20200618
Implantable Lead Location: 753859
Implantable Lead Location: 753860
Implantable Pulse Generator Implant Date: 20200618
Lead Channel Impedance Value: 450 Ohm
Lead Channel Impedance Value: 540 Ohm
Lead Channel Pacing Threshold Amplitude: 0.5 V
Lead Channel Pacing Threshold Amplitude: 1 V
Lead Channel Pacing Threshold Pulse Width: 0.4 ms
Lead Channel Pacing Threshold Pulse Width: 0.4 ms
Lead Channel Sensing Intrinsic Amplitude: 1.4 mV
Lead Channel Sensing Intrinsic Amplitude: 8.7 mV
Lead Channel Setting Pacing Amplitude: 2 V
Lead Channel Setting Pacing Amplitude: 2.5 V
Lead Channel Setting Pacing Pulse Width: 0.4 ms
Lead Channel Setting Sensing Sensitivity: 2 mV
Pulse Gen Model: 2272
Pulse Gen Serial Number: 3310664

## 2021-02-03 NOTE — Progress Notes (Signed)
Remote pacemaker transmission.   

## 2021-02-28 IMAGING — CR CHEST - 2 VIEW
2 series · 2 of 2 positions shown · non-contrast
Comparison: 04/07/2019

CLINICAL DATA: Status post pacer placement.

EXAM:
CHEST - 2 VIEW

[chest lat]
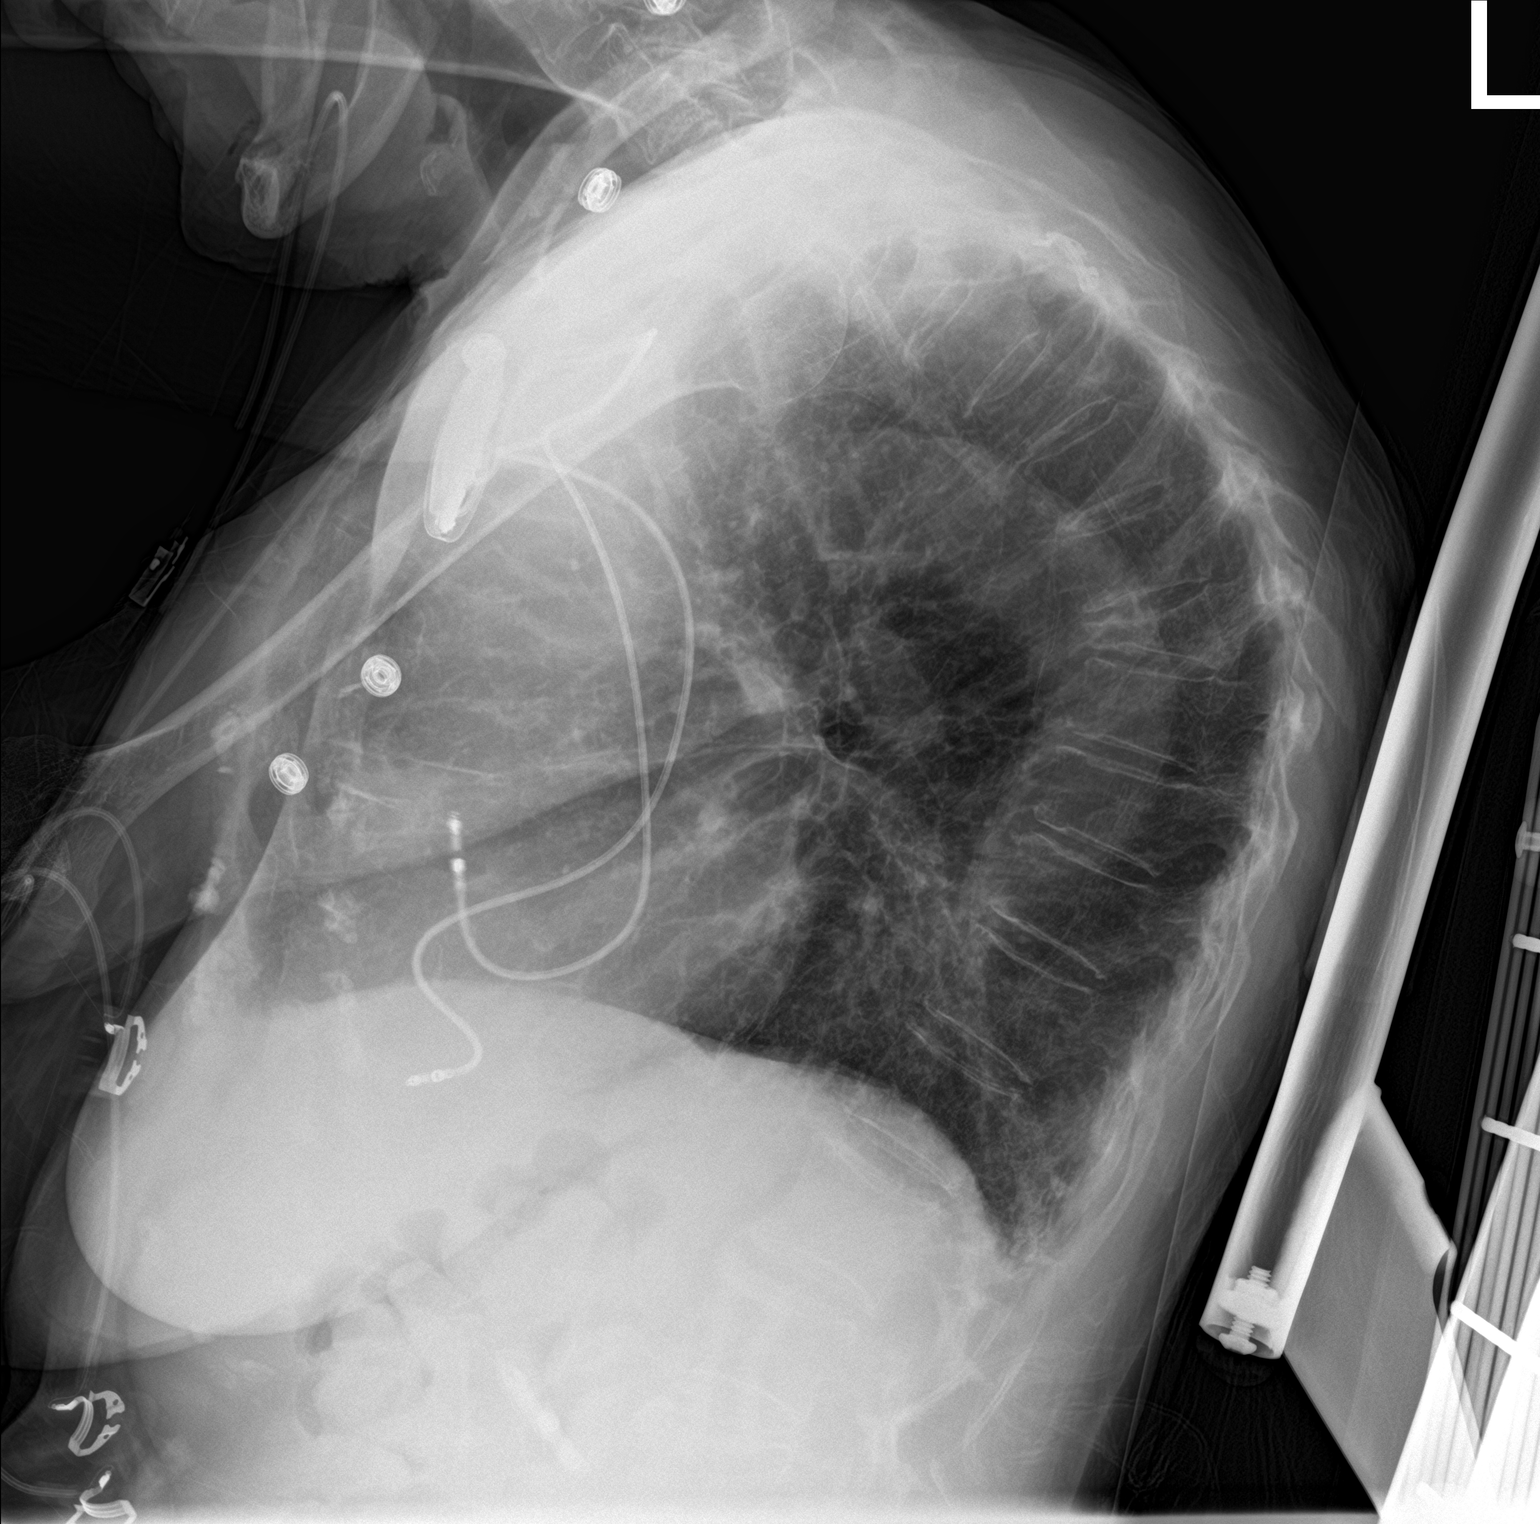

[chest ap]
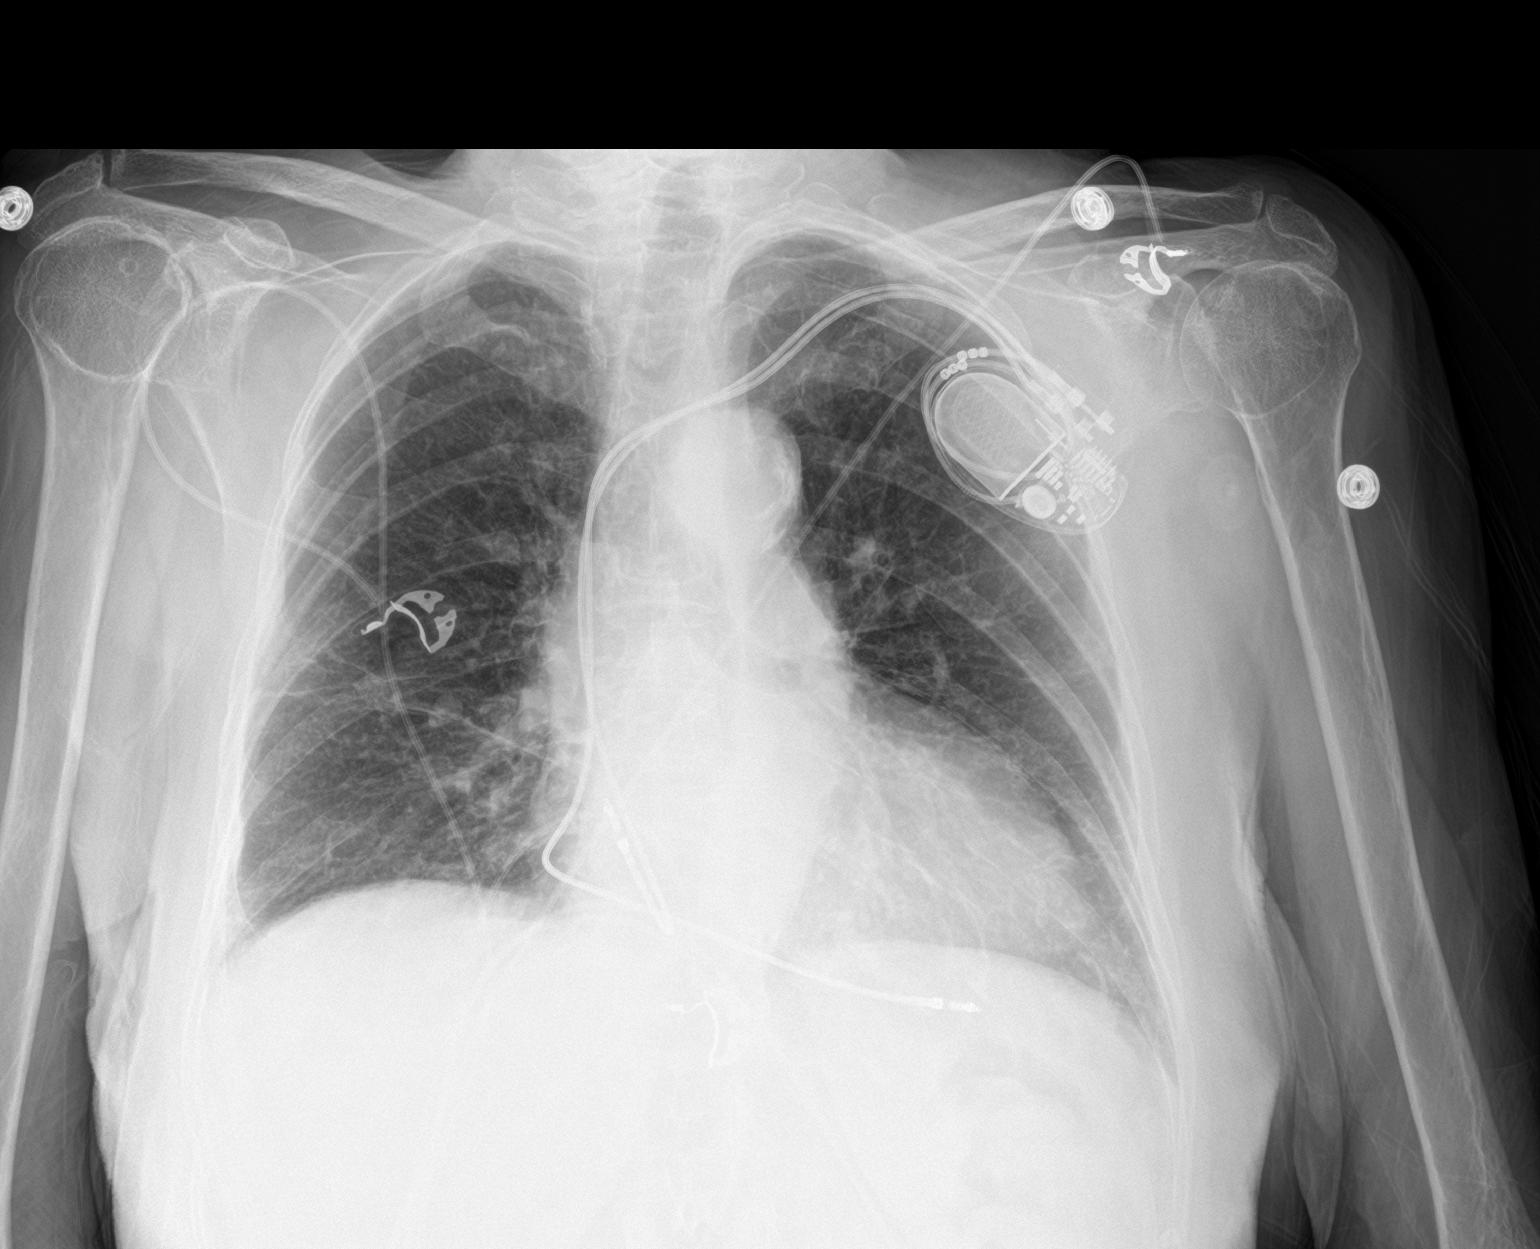

[2 of 2 positions shown; findings below may reference images not displayed]

FINDINGS: Interval placement of left chest wall pacer device. Leads project
over the right atrial appendage and right ventricle. No
pneumothorax. Normal heart size. Aortic atherosclerosis. No pleural
effusion or edema. No airspace opacities. The bones appear
osteopenic. There is a compression deformity identified within the
midthoracic spine at the approximate T7 level resulting in thoracic
kyphosis. Slightly increased from previous study.
IMPRESSION: 1. No complications status post left chest wall pacer placement.
2.  Aortic Atherosclerosis ([id number]).
3. Progression of T7 compression deformity with resulting kyphosis
of the thoracic spine.

## 2021-04-24 ENCOUNTER — Ambulatory Visit (INDEPENDENT_AMBULATORY_CARE_PROVIDER_SITE_OTHER): Payer: No Typology Code available for payment source

## 2021-04-24 DIAGNOSIS — I442 Atrioventricular block, complete: Secondary | ICD-10-CM | POA: Diagnosis not present

## 2021-04-25 LAB — CUP PACEART REMOTE DEVICE CHECK
Battery Remaining Longevity: 107 mo
Battery Remaining Percentage: 85 %
Battery Voltage: 3.01 V
Brady Statistic AP VP Percent: 1 %
Brady Statistic AP VS Percent: 23 %
Brady Statistic AS VP Percent: 1 %
Brady Statistic AS VS Percent: 76 %
Brady Statistic RA Percent Paced: 22 %
Brady Statistic RV Percent Paced: 1 %
Date Time Interrogation Session: 20220627020012
Implantable Lead Implant Date: 20200618
Implantable Lead Implant Date: 20200618
Implantable Lead Location: 753859
Implantable Lead Location: 753860
Implantable Pulse Generator Implant Date: 20200618
Lead Channel Impedance Value: 450 Ohm
Lead Channel Impedance Value: 590 Ohm
Lead Channel Pacing Threshold Amplitude: 0.5 V
Lead Channel Pacing Threshold Amplitude: 1 V
Lead Channel Pacing Threshold Pulse Width: 0.4 ms
Lead Channel Pacing Threshold Pulse Width: 0.4 ms
Lead Channel Sensing Intrinsic Amplitude: 1.1 mV
Lead Channel Sensing Intrinsic Amplitude: 8.4 mV
Lead Channel Setting Pacing Amplitude: 2 V
Lead Channel Setting Pacing Amplitude: 2.5 V
Lead Channel Setting Pacing Pulse Width: 0.4 ms
Lead Channel Setting Sensing Sensitivity: 2 mV
Pulse Gen Model: 2272
Pulse Gen Serial Number: 3310664

## 2021-05-11 NOTE — Progress Notes (Signed)
Remote pacemaker transmission.   

## 2021-06-12 ENCOUNTER — Encounter: Payer: No Typology Code available for payment source | Admitting: Cardiology

## 2021-06-12 NOTE — Progress Notes (Deleted)
Electrophysiology Office Note   Date:  06/12/2021   ID:  Bethany Nichols, DOB 1933/09/20, MRN [id number]  PCP:  Marco Collie, MD  Cardiologist:   Primary Electrophysiologist:  [name], MD    Chief Complaint: pacemaker   History of Present Illness: Bethany Nichols is a 87 y.o. female who is being seen today for the evaluation of pacemaker at the request of Marco Collie, MD. Presenting today for electrophysiology evaluation.  She has a history significant for hypertension, hyperlipidemia, and CVA.  She initially presented to the hospital 04/08/2019 with weakness and bradycardia.  ZIO monitor showed 7-second pauses.  She has now status post [place] dual-chamber pacemaker implanted 04/16/2019.  Today, denies symptoms of palpitations, chest pain, shortness of breath, orthopnea, PND, lower extremity edema, claudication, dizziness, presyncope, syncope, bleeding, or neurologic sequela. The patient is tolerating medications without difficulties. ***    Past Medical History:  Diagnosis Date   Anxiety    Complete heart block (HCC)    CVA (cerebral vascular accident) ([place])    Depression    Hyperlipidemia    Hypertension    Past Surgical History:  Procedure Laterality Date   PACEMAKER IMPLANT N/A 04/16/2019   Procedure: PACEMAKER IMPLANT;  Surgeon: Constance Haw, MD;  Location: [facility];  Service: Cardiovascular;  Laterality: N/A;     Current Outpatient Medications  Medication Sig Dispense Refill   acetaminophen (TYLENOL) 650 MG CR tablet Take 650 mg by mouth every 12 (twelve) hours as needed for pain.     alendronate (FOSAMAX) 70 MG tablet Take 70 mg by mouth once a week. Take with a full glass of water on an empty stomach.     aspirin EC 81 MG tablet Take 81 mg by mouth daily.     b complex vitamins tablet Take 1 tablet by mouth daily.     calcium carbonate (OSCAL) 1500 (600 Ca) MG TABS tablet Take 600 mg of elemental calcium by mouth 2 (two)  times daily with a meal.     calcium carbonate (TUMS - DOSED IN MG ELEMENTAL CALCIUM) 500 MG chewable tablet Chew 1 tablet by mouth 2 (two) times daily as needed for indigestion or heartburn.     cetaphil (CETAPHIL) cream Apply 1 application topically 2 (two) times daily as needed (dry skin).     Melatonin 5 MG TABS Take 5 mg by mouth at bedtime.     Menthol-Methyl Salicylate (MUSCLE RUB) 10-15 % CREA Apply 1 application topically 3 (three) times daily as needed for muscle pain.     sertraline (ZOLOFT) 50 MG tablet Take 50 mg by mouth daily.     vitamin E 1000 UNIT capsule Take 1,000 Units by mouth daily.     No current facility-administered medications for this visit.    Allergies:   Levofloxacin and Allegra [fexofenadine]   Social History:  The patient  reports that she has never smoked. She has never used smokeless tobacco.   Family History:  The patient's family history includes Breast cancer in her mother.   ROS:  Please see the history of present illness.   Otherwise, review of systems is positive for none.   All other systems are reviewed and negative.   PHYSICAL EXAM: VS:  There were no vitals taken for this visit. , BMI There is no height or weight on file to calculate BMI. GEN: Well nourished, well developed, in no acute distress  HEENT: normal  Neck: no JVD, carotid bruits,  or masses Cardiac: ***RRR; no murmurs, rubs, or gallops,no edema  Respiratory:  clear to auscultation bilaterally, normal work of breathing GI: soft, nontender, nondistended, + BS MS: no deformity or atrophy  Skin: warm and dry, device site well healed Neuro:  Strength and sensation are intact Psych: euthymic mood, full affect  EKG:  EKG {ACTION; IS/IS [id number] ordered today. Personal review of the ekg ordered *** shows ***  Personal review of the device interrogation today. Results in [place]: No results found for requested labs within last 8760 hours.    Lipid Panel  No  results found for: CHOL, TRIG, HDL, CHOLHDL, VLDL, LDLCALC, LDLDIRECT   Wt Readings from Last 3 Encounters:  06/13/20 129 lb (58.5 kg)  07/20/19 133 lb 9.6 oz (60.6 kg)  04/17/19 124 lb (56.2 kg)      Other studies Reviewed: Additional studies/ records that were reviewed today include: TTE 04/09/19 Review of the above records today demonstrates:   1. The left ventricle has normal systolic function with an ejection fraction of 60-65%. The cavity size was normal. Left ventricular diastolic Doppler parameters are consistent with impaired relaxation.  2. The right ventricle has normal systolic function. The cavity was normal. There is no increase in right ventricular wall thickness. Right ventricular systolic pressure is mildly elevated with an estimated pressure of 38.7 mmHg.  3. Mild sclerosis of the aortic valve. Aortic valve regurgitation is mild by color flow Doppler.  4. The inferior vena cava was dilated in size with >50% respiratory variability.   ASSESSMENT AND PLAN:  1.  Intermittent complete heart block: Status post Saint Jude dual-chamber pacemaker implanted 04/16/2019.  Device functioning appropriately.  No changes at this time.  2.  Hypertension:***  Current medicines are reviewed at length with the patient today.   The patient does not have concerns regarding her medicines.  The following changes were made today:  ***  Labs/ tests ordered today include:  No orders of the defined types were placed in this encounter.    Disposition:   FU with [name] *** months  Signed, Ethelene Closser Meredith Leeds, MD  06/12/2021 9:56 AM     [facility] [address] [place] [address] [phone] (office) [phone] (fax)

## 2021-06-13 ENCOUNTER — Encounter: Payer: Self-pay | Admitting: Cardiology

## 2021-07-24 ENCOUNTER — Ambulatory Visit (INDEPENDENT_AMBULATORY_CARE_PROVIDER_SITE_OTHER): Payer: No Typology Code available for payment source

## 2021-07-24 DIAGNOSIS — I442 Atrioventricular block, complete: Secondary | ICD-10-CM

## 2021-07-24 LAB — CUP PACEART REMOTE DEVICE CHECK
Battery Remaining Longevity: 103 mo
Battery Remaining Percentage: 82 %
Battery Voltage: 3.01 V
Brady Statistic AP VP Percent: 1 %
Brady Statistic AP VS Percent: 23 %
Brady Statistic AS VP Percent: 1 %
Brady Statistic AS VS Percent: 76 %
Brady Statistic RA Percent Paced: 22 %
Brady Statistic RV Percent Paced: 1 %
Date Time Interrogation Session: 20220926020014
Implantable Lead Implant Date: 20200618
Implantable Lead Implant Date: 20200618
Implantable Lead Location: 753859
Implantable Lead Location: 753860
Implantable Pulse Generator Implant Date: 20200618
Lead Channel Impedance Value: 440 Ohm
Lead Channel Impedance Value: 560 Ohm
Lead Channel Pacing Threshold Amplitude: 0.5 V
Lead Channel Pacing Threshold Amplitude: 1 V
Lead Channel Pacing Threshold Pulse Width: 0.4 ms
Lead Channel Pacing Threshold Pulse Width: 0.4 ms
Lead Channel Sensing Intrinsic Amplitude: 1 mV
Lead Channel Sensing Intrinsic Amplitude: 8.7 mV
Lead Channel Setting Pacing Amplitude: 2 V
Lead Channel Setting Pacing Amplitude: 2.5 V
Lead Channel Setting Pacing Pulse Width: 0.4 ms
Lead Channel Setting Sensing Sensitivity: 2 mV
Pulse Gen Model: 2272
Pulse Gen Serial Number: 3310664

## 2021-07-31 NOTE — Progress Notes (Signed)
Remote pacemaker transmission.   

## 2021-09-25 ENCOUNTER — Encounter: Payer: No Typology Code available for payment source | Admitting: Cardiology

## 2021-09-25 NOTE — Progress Notes (Deleted)
Electrophysiology Office Note   Date:  09/25/2021   ID:  Bethany Nichols, DOB 11/03/1932, MRN [id number]  PCP:  Marco Collie, MD  Cardiologist:   Primary Electrophysiologist:  Fariha Goto Meredith Leeds, MD    Chief Complaint: pacemaker   History of Present Illness: Bethany Nichols is a 87 y.o. female who is being seen today for the evaluation of pacemaker at the request of Marco Collie, MD. Presenting today for electrophysiology evaluation.  She has a history significant for hypertension, hyperlipidemia, CVA.  She presented to the hospital 04/08/2019 with weakness and bradycardia.  She was discharged with a ZIO monitor showing 7-second pauses.  She is now status post [place] dual-chamber pacemaker implanted 04/16/2019.  Today, denies symptoms of palpitations, chest pain, shortness of breath, orthopnea, PND, lower extremity edema, claudication, dizziness, presyncope, syncope, bleeding, or neurologic sequela. The patient is tolerating medications without difficulties. ***    Past Medical History:  Diagnosis Date   Anxiety    Complete heart block (HCC)    CVA (cerebral vascular accident) ([place])    Depression    Hyperlipidemia    Hypertension    Past Surgical History:  Procedure Laterality Date   PACEMAKER IMPLANT N/A 04/16/2019   Procedure: PACEMAKER IMPLANT;  Surgeon: Constance Haw, MD;  Location: [facility];  Service: Cardiovascular;  Laterality: N/A;     Current Outpatient Medications  Medication Sig Dispense Refill   acetaminophen (TYLENOL) 650 MG CR tablet Take 650 mg by mouth every 12 (twelve) hours as needed for pain.     alendronate (FOSAMAX) 70 MG tablet Take 70 mg by mouth once a week. Take with a full glass of water on an empty stomach.     aspirin EC 81 MG tablet Take 81 mg by mouth daily.     b complex vitamins tablet Take 1 tablet by mouth daily.     calcium carbonate (OSCAL) 1500 (600 Ca) MG TABS tablet Take 600 mg of elemental calcium by  mouth 2 (two) times daily with a meal.     calcium carbonate (TUMS - DOSED IN MG ELEMENTAL CALCIUM) 500 MG chewable tablet Chew 1 tablet by mouth 2 (two) times daily as needed for indigestion or heartburn.     cetaphil (CETAPHIL) cream Apply 1 application topically 2 (two) times daily as needed (dry skin).     Melatonin 5 MG TABS Take 5 mg by mouth at bedtime.     Menthol-Methyl Salicylate (MUSCLE RUB) 10-15 % CREA Apply 1 application topically 3 (three) times daily as needed for muscle pain.     sertraline (ZOLOFT) 50 MG tablet Take 50 mg by mouth daily.     vitamin E 1000 UNIT capsule Take 1,000 Units by mouth daily.     No current facility-administered medications for this visit.    Allergies:   Levofloxacin and Allegra [fexofenadine]   Social History:  The patient  reports that she has never smoked. She has never used smokeless tobacco.   Family History:  The patient's family history includes Breast cancer in her mother.   ROS:  Please see the history of present illness.   Otherwise, review of systems is positive for none.   All other systems are reviewed and negative.   PHYSICAL EXAM: VS:  There were no vitals taken for this visit. , BMI There is no height or weight on file to calculate BMI. GEN: Well nourished, well developed, in no acute distress  HEENT: normal  Neck: no  JVD, carotid bruits, or masses Cardiac: ***RRR; no murmurs, rubs, or gallops,no edema  Respiratory:  clear to auscultation bilaterally, normal work of breathing GI: soft, nontender, nondistended, + BS MS: no deformity or atrophy  Skin: warm and dry, device site well healed Neuro:  Strength and sensation are intact Psych: euthymic mood, full affect  EKG:  EKG {ACTION; IS/IS [id number]} ordered today. Personal review of the ekg ordered *** shows ***  Personal review of the device interrogation today. Results in [place]: No results found for requested labs within last 8760 hours.     Lipid Panel  No results found for: CHOL, TRIG, HDL, CHOLHDL, VLDL, LDLCALC, LDLDIRECT   Wt Readings from Last 3 Encounters:  06/13/20 129 lb (58.5 kg)  07/20/19 133 lb 9.6 oz (60.6 kg)  04/17/19 124 lb (56.2 kg)      Other studies Reviewed: Additional studies/ records that were reviewed today include: TTE 04/09/19 Review of the above records today demonstrates:   1. The left ventricle has normal systolic function with an ejection fraction of 60-65%. The cavity size was normal. Left ventricular diastolic Doppler parameters are consistent with impaired relaxation.  2. The right ventricle has normal systolic function. The cavity was normal. There is no increase in right ventricular wall thickness. Right ventricular systolic pressure is mildly elevated with an estimated pressure of 38.7 mmHg.  3. Mild sclerosis of the aortic valve. Aortic valve regurgitation is mild by color flow Doppler.  4. The inferior vena cava was dilated in size with >50% respiratory variability.   ASSESSMENT AND PLAN:  1.  Intermittent complete heart block: Status post Saint Jude dual-chamber pacemaker implanted 04/16/2019.  Device functioning appropriately.  No changes at this time.  2.  Hypertension:***  Current medicines are reviewed at length with the patient today.   The patient does not have concerns regarding her medicines.  The following changes were made today:  ***  Labs/ tests ordered today include:  No orders of the defined types were placed in this encounter.    Disposition:   FU with Tyshae Stair *** months  Signed, Kalmen Lollar Meredith Leeds, MD  09/25/2021 9:11 AM     [facility] [address] [place] [address] [phone] (office) [phone] (fax)

## 2021-10-04 ENCOUNTER — Encounter: Payer: No Typology Code available for payment source | Admitting: Cardiology

## 2021-10-24 ENCOUNTER — Ambulatory Visit (INDEPENDENT_AMBULATORY_CARE_PROVIDER_SITE_OTHER): Payer: No Typology Code available for payment source

## 2021-10-24 DIAGNOSIS — I442 Atrioventricular block, complete: Secondary | ICD-10-CM

## 2021-10-24 LAB — CUP PACEART REMOTE DEVICE CHECK
Battery Remaining Longevity: 100 mo
Battery Remaining Percentage: 80 %
Battery Voltage: 3.01 V
Brady Statistic AP VP Percent: 1 %
Brady Statistic AP VS Percent: 22 %
Brady Statistic AS VP Percent: 1 %
Brady Statistic AS VS Percent: 77 %
Brady Statistic RA Percent Paced: 22 %
Brady Statistic RV Percent Paced: 1 %
Date Time Interrogation Session: 20221226020013
Implantable Lead Implant Date: 20200618
Implantable Lead Implant Date: 20200618
Implantable Lead Location: 753859
Implantable Lead Location: 753860
Implantable Pulse Generator Implant Date: 20200618
Lead Channel Impedance Value: 440 Ohm
Lead Channel Impedance Value: 490 Ohm
Lead Channel Pacing Threshold Amplitude: 0.5 V
Lead Channel Pacing Threshold Amplitude: 1 V
Lead Channel Pacing Threshold Pulse Width: 0.4 ms
Lead Channel Pacing Threshold Pulse Width: 0.4 ms
Lead Channel Sensing Intrinsic Amplitude: 1.1 mV
Lead Channel Sensing Intrinsic Amplitude: 8.7 mV
Lead Channel Setting Pacing Amplitude: 2 V
Lead Channel Setting Pacing Amplitude: 2.5 V
Lead Channel Setting Pacing Pulse Width: 0.4 ms
Lead Channel Setting Sensing Sensitivity: 2 mV
Pulse Gen Model: 2272
Pulse Gen Serial Number: 3310664

## 2021-11-02 NOTE — Progress Notes (Signed)
Remote pacemaker transmission.   

## 2021-12-04 ENCOUNTER — Encounter: Payer: No Typology Code available for payment source | Admitting: Cardiology

## 2021-12-14 ENCOUNTER — Ambulatory Visit: Payer: No Typology Code available for payment source | Admitting: Cardiology

## 2022-01-22 ENCOUNTER — Ambulatory Visit (INDEPENDENT_AMBULATORY_CARE_PROVIDER_SITE_OTHER): Payer: No Typology Code available for payment source

## 2022-01-22 DIAGNOSIS — I442 Atrioventricular block, complete: Secondary | ICD-10-CM

## 2022-01-23 LAB — CUP PACEART REMOTE DEVICE CHECK
Battery Remaining Longevity: 98 mo
Battery Remaining Percentage: 78 %
Battery Voltage: 3.01 V
Brady Statistic AP VP Percent: 1 %
Brady Statistic AP VS Percent: 22 %
Brady Statistic AS VP Percent: 1 %
Brady Statistic AS VS Percent: 77 %
Brady Statistic RA Percent Paced: 21 %
Brady Statistic RV Percent Paced: 1 %
Date Time Interrogation Session: 20230327020032
Implantable Lead Implant Date: 20200618
Implantable Lead Implant Date: 20200618
Implantable Lead Location: 753859
Implantable Lead Location: 753860
Implantable Pulse Generator Implant Date: 20200618
Lead Channel Impedance Value: 440 Ohm
Lead Channel Impedance Value: 540 Ohm
Lead Channel Pacing Threshold Amplitude: 0.5 V
Lead Channel Pacing Threshold Amplitude: 1 V
Lead Channel Pacing Threshold Pulse Width: 0.4 ms
Lead Channel Pacing Threshold Pulse Width: 0.4 ms
Lead Channel Sensing Intrinsic Amplitude: 1.4 mV
Lead Channel Sensing Intrinsic Amplitude: 7.4 mV
Lead Channel Setting Pacing Amplitude: 2 V
Lead Channel Setting Pacing Amplitude: 2.5 V
Lead Channel Setting Pacing Pulse Width: 0.4 ms
Lead Channel Setting Sensing Sensitivity: 2 mV
Pulse Gen Model: 2272
Pulse Gen Serial Number: 3310664

## 2022-02-01 NOTE — Progress Notes (Signed)
Remote pacemaker transmission.   

## 2022-03-05 ENCOUNTER — Ambulatory Visit (INDEPENDENT_AMBULATORY_CARE_PROVIDER_SITE_OTHER): Payer: No Typology Code available for payment source | Admitting: Cardiology

## 2022-03-05 ENCOUNTER — Encounter: Payer: Self-pay | Admitting: Cardiology

## 2022-03-05 VITALS — BP 110/62 | HR 73 | Ht 60.0 in | Wt 133.8 lb

## 2022-03-05 DIAGNOSIS — I442 Atrioventricular block, complete: Secondary | ICD-10-CM

## 2022-03-05 NOTE — Patient Instructions (Addendum)
Medication Instructions:  ?Your physician recommends that you continue on your current medications as directed. Please refer to the Current Medication list given to you today. ? ?*If you need a refill on your cardiac medications before your next appointment, please call your pharmacy* ? ? ?Lab Work: ?None ordered ? ?Testing/Procedures: ?None ordered ? ? ?Follow-Up: ?At [facility], you and your health needs are our priority.  As part of our continuing mission to provide you with exceptional heart care, we have created designated Provider Care Teams.  These Care Teams include your primary Cardiologist (physician) and Advanced Practice Providers (APPs -  Physician Assistants and Nurse Practitioners) who all work together to provide you with the care you need, when you need it. ? ?We recommend signing up for the patient portal called "MyChart".  Sign up information is provided on this After Visit Summary.  MyChart is used to connect with patients for Virtual Visits (Telemedicine).  Patients are able to view lab/test results, encounter notes, upcoming appointments, etc.  Non-urgent messages can be sent to your provider as well.   ?To learn more about what you can do with MyChart, go to [email or web address].   ? ?Remote monitoring is used to monitor your Pacemaker or ICD from home. This monitoring reduces the number of office visits required to check your device to one time per year. It allows [place] to keep an eye on the functioning of your device to ensure it is working properly. You are scheduled for a device check from home on 04/23/2022. You may send your transmission at any time that day. If you have a wireless device, the transmission will be sent automatically. After your physician reviews your transmission, you will receive a postcard with your next transmission date. ? ?Your next appointment:   ?1 year(s) ? ?The format for your next appointment:   ?In Person ? ?Provider:   ?Allegra Lai, MD  ? ? ?Thank you for  choosing [facility]!! ? ? ?Trinidad Curet, RN ?[phone] ? ? ? ?Other Instructions ?Please call the office and update [place] if a medication is missing from your medication list, that you are taking at home ? ? ?Important Information About Sugar ? ? ? ? ?  ?

## 2022-03-05 NOTE — Progress Notes (Signed)
? ?Electrophysiology Office Note ? ? ?Date:  03/05/2022  ? ?ID:  Bethany Nichols, DOB 1933-05-31, MRN [id number] ? ?PCP:  Marco Collie, MD  ?Cardiologist:   ?Primary Electrophysiologist:  Nyeema Want Meredith Leeds, MD   ? ?Chief Complaint: pacemaker ?  ?History of Present Illness: ?Bethany Nichols is a 88 y.o. female who is being seen today for the evaluation of pacemaker at the request of Marco Collie, MD. Presenting today for electrophysiology evaluation. ? ?She has a history significant for hypertension, hyperlipidemia, CVA.  She initially presented to the hospital 04/08/2019 with weakness and bradycardia.  She was discharged with a Zio monitor which showed 7-second pauses.  She is status post Saint Jude dual-chamber pacemaker implanted 04/16/2019. ? ?Today, denies symptoms of palpitations, chest pain, shortness of breath, orthopnea, PND, lower extremity edema, claudication, dizziness, presyncope, syncope, bleeding, or neurologic sequela. The patient is tolerating medications without difficulties.  She is feeling well today.  Her main complaint is balance issues and her memory.  She walks with a walker.  She had a fall getting on a bus recently and hurt her leg.  As far as cardiac issues, she has no complaints and feels that she has been doing well. ? ? ?Past Medical History:  ?Diagnosis Date  ? Anxiety   ? Complete heart block ([place])   ? CVA (cerebral vascular accident) [facility])   ? Depression   ? Hyperlipidemia   ? Hypertension   ? ?Past Surgical History:  ?Procedure Laterality Date  ? PACEMAKER IMPLANT N/A 04/16/2019  ? Procedure: PACEMAKER IMPLANT;  Surgeon: Constance Haw, MD;  Location: [place] CV [facility];  Service: Cardiovascular;  Laterality: N/A;  ? ? ? ?Current Outpatient Medications  ?Medication Sig Dispense Refill  ? acetaminophen (TYLENOL) 650 MG CR tablet Take 650 mg by mouth every 12 (twelve) hours as needed for pain.    ? alendronate (FOSAMAX) 70 MG tablet Take 70 mg by mouth once a week. Take  with a full glass of water on an empty stomach.    ? aspirin EC 81 MG tablet Take 81 mg by mouth daily.    ? b complex vitamins tablet Take 1 tablet by mouth daily.    ? calcium carbonate (OSCAL) 1500 (600 Ca) MG TABS tablet Take 600 mg of elemental calcium by mouth 2 (two) times daily with a meal.    ? calcium carbonate (TUMS - DOSED IN MG ELEMENTAL CALCIUM) 500 MG chewable tablet Chew 1 tablet by mouth 2 (two) times daily as needed for indigestion or heartburn.    ? cetaphil (CETAPHIL) cream Apply 1 application topically 2 (two) times daily as needed (dry skin).    ? Menthol-Methyl Salicylate (MUSCLE RUB) 10-15 % CREA Apply 1 application topically 3 (three) times daily as needed for muscle pain.    ? sertraline (ZOLOFT) 50 MG tablet Take 50 mg by mouth daily.    ? vitamin E 1000 UNIT capsule Take 1,000 Units by mouth daily.    ? ?No current facility-administered medications for this visit.  ? ? ?Allergies:   Levofloxacin and Allegra [fexofenadine]  ? ?Social History:  The patient  reports that she has never smoked. She has never used smokeless tobacco.  ? ?Family History:  The patient's family history includes Breast cancer in her mother.  ? ?ROS:  Please see the history of present illness.   Otherwise, review of systems is positive for none.   All other systems are reviewed and negative.  ? ?PHYSICAL EXAM: ?  VS:  BP 110/62   Pulse 73   Ht 5' (1.524 m)   Wt 133 lb 12.8 oz (60.7 kg)   SpO2 91%   BMI 26.13 kg/m?  , BMI Body mass index is 26.13 kg/m?. ?GEN: Well nourished, well developed, in no acute distress  ?HEENT: normal  ?Neck: no JVD, carotid bruits, or masses ?Cardiac: RRR; no murmurs, rubs, or gallops,no edema  ?Respiratory:  clear to auscultation bilaterally, normal work of breathing ?GI: soft, nontender, nondistended, + BS ?MS: no deformity or atrophy  ?Skin: warm and dry, device site well healed ?Neuro:  Strength and sensation are intact ?Psych: euthymic mood, full affect ? ?EKG:  EKG is ordered  today. ?Personal review of the ekg ordered shows sinus rhythm, LBBB, 1dAVB ? ?Personal review of the device interrogation today. Results in [place]  ? ? ?Recent Labs: ?No results found for requested labs within last 8760 hours.  ? ? ?Lipid Panel  ?No results found for: CHOL, TRIG, HDL, CHOLHDL, VLDL, LDLCALC, LDLDIRECT ? ? ?Wt Readings from Last 3 Encounters:  ?03/05/22 133 lb 12.8 oz (60.7 kg)  ?06/13/20 129 lb (58.5 kg)  ?07/20/19 133 lb 9.6 oz (60.6 kg)  ?  ? ? ?Other studies Reviewed: ?Additional studies/ records that were reviewed today include: TTE 04/09/19 ?Review of the above records today demonstrates:  ? 1. The left ventricle has normal systolic function with an ejection fraction of 60-65%. The cavity size was normal. Left ventricular diastolic Doppler parameters are consistent with impaired relaxation. ? 2. The right ventricle has normal systolic function. The cavity was normal. There is no increase in right ventricular wall thickness. Right ventricular systolic pressure is mildly elevated with an estimated pressure of 38.7 mmHg. ? 3. Mild sclerosis of the aortic valve. Aortic valve regurgitation is mild by color flow Doppler. ? 4. The inferior vena cava was dilated in size with >50% respiratory variability. ? ? ?ASSESSMENT AND PLAN: ? ?1.  Intermittent complete heart block: Status post Saint Jude dual-chamber pacemaker implanted 04/16/2019.  Device functioning appropriately.  No changes at this time. ? ?2.  Hypertension: well controlled ? ?Current medicines are reviewed at length with the patient today.   ?The patient does not have concerns regarding her medicines.  The following changes were made today:  none ? ?Labs/ tests ordered today include:  ?Orders Placed This Encounter  ?Procedures  ? EKG 12-Lead  ? ? ? ?Disposition:   FU with Alonza Knisley 12 months ? ?Signed, ?Jacquelyn Antony Meredith Leeds, MD  ?03/05/2022 10:11 AM    ? ?[facility] ?[address] ?[address] ?[place] [address] ?[phone]  (office) ?[phone] (fax) ?

## 2022-04-23 ENCOUNTER — Ambulatory Visit (INDEPENDENT_AMBULATORY_CARE_PROVIDER_SITE_OTHER): Payer: No Typology Code available for payment source

## 2022-04-23 DIAGNOSIS — I442 Atrioventricular block, complete: Secondary | ICD-10-CM

## 2022-04-24 LAB — CUP PACEART REMOTE DEVICE CHECK
Battery Remaining Longevity: 95 mo
Battery Remaining Percentage: 76 %
Battery Voltage: 3.01 V
Brady Statistic AP VP Percent: 1 %
Brady Statistic AP VS Percent: 18 %
Brady Statistic AS VP Percent: 1.2 %
Brady Statistic AS VS Percent: 79 %
Brady Statistic RA Percent Paced: 18 %
Brady Statistic RV Percent Paced: 1.9 %
Date Time Interrogation Session: 20230626020014
Implantable Lead Implant Date: 20200618
Implantable Lead Implant Date: 20200618
Implantable Lead Location: 753859
Implantable Lead Location: 753860
Implantable Pulse Generator Implant Date: 20200618
Lead Channel Impedance Value: 440 Ohm
Lead Channel Impedance Value: 600 Ohm
Lead Channel Pacing Threshold Amplitude: 0.5 V
Lead Channel Pacing Threshold Amplitude: 1 V
Lead Channel Pacing Threshold Pulse Width: 0.4 ms
Lead Channel Pacing Threshold Pulse Width: 0.4 ms
Lead Channel Sensing Intrinsic Amplitude: 1.5 mV
Lead Channel Sensing Intrinsic Amplitude: 11.3 mV
Lead Channel Setting Pacing Amplitude: 2 V
Lead Channel Setting Pacing Amplitude: 2.5 V
Lead Channel Setting Pacing Pulse Width: 0.4 ms
Lead Channel Setting Sensing Sensitivity: 2 mV
Pulse Gen Model: 2272
Pulse Gen Serial Number: 3310664

## 2022-05-17 NOTE — Progress Notes (Signed)
Remote pacemaker transmission.   

## 2022-07-23 ENCOUNTER — Ambulatory Visit (INDEPENDENT_AMBULATORY_CARE_PROVIDER_SITE_OTHER): Payer: No Typology Code available for payment source

## 2022-07-23 DIAGNOSIS — I442 Atrioventricular block, complete: Secondary | ICD-10-CM

## 2022-07-24 LAB — CUP PACEART REMOTE DEVICE CHECK
Battery Remaining Longevity: 92 mo
Battery Remaining Percentage: 74 %
Battery Voltage: 3.01 V
Brady Statistic AP VP Percent: 1 %
Brady Statistic AP VS Percent: 18 %
Brady Statistic AS VP Percent: 1 %
Brady Statistic AS VS Percent: 80 %
Brady Statistic RA Percent Paced: 17 %
Brady Statistic RV Percent Paced: 1.2 %
Date Time Interrogation Session: 20230925020012
Implantable Lead Implant Date: 20200618
Implantable Lead Implant Date: 20200618
Implantable Lead Location: 753859
Implantable Lead Location: 753860
Implantable Pulse Generator Implant Date: 20200618
Lead Channel Impedance Value: 440 Ohm
Lead Channel Impedance Value: 630 Ohm
Lead Channel Pacing Threshold Amplitude: 0.5 V
Lead Channel Pacing Threshold Amplitude: 1 V
Lead Channel Pacing Threshold Pulse Width: 0.4 ms
Lead Channel Pacing Threshold Pulse Width: 0.4 ms
Lead Channel Sensing Intrinsic Amplitude: 1.8 mV
Lead Channel Sensing Intrinsic Amplitude: 12 mV
Lead Channel Setting Pacing Amplitude: 2 V
Lead Channel Setting Pacing Amplitude: 2.5 V
Lead Channel Setting Pacing Pulse Width: 0.4 ms
Lead Channel Setting Sensing Sensitivity: 2 mV
Pulse Gen Model: 2272
Pulse Gen Serial Number: 3310664

## 2022-07-30 DIAGNOSIS — I361 Nonrheumatic tricuspid (valve) insufficiency: Secondary | ICD-10-CM | POA: Diagnosis not present

## 2022-07-30 DIAGNOSIS — I351 Nonrheumatic aortic (valve) insufficiency: Secondary | ICD-10-CM | POA: Diagnosis not present

## 2022-07-30 DIAGNOSIS — I340 Nonrheumatic mitral (valve) insufficiency: Secondary | ICD-10-CM | POA: Diagnosis not present

## 2022-07-31 ENCOUNTER — Inpatient Hospital Stay (HOSPITAL_COMMUNITY)
Admission: EM | Admit: 2022-07-31 | Discharge: 2022-08-03 | DRG: 286 | Disposition: A | Discharge status: Skilled Nursing Facility | Payer: No Typology Code available for payment source | Source: Other Acute Inpatient Hospital | Attending: Cardiology | Admitting: Cardiology

## 2022-07-31 ENCOUNTER — Encounter (HOSPITAL_COMMUNITY)
Admission: EM | Disposition: A | Discharge status: Skilled Nursing Facility | Payer: Self-pay | Source: Other Acute Inpatient Hospital | Attending: Cardiology

## 2022-07-31 DIAGNOSIS — Z79899 Other long term (current) drug therapy: Secondary | ICD-10-CM

## 2022-07-31 DIAGNOSIS — I5021 Acute systolic (congestive) heart failure: Principal | ICD-10-CM

## 2022-07-31 DIAGNOSIS — I5023 Acute on chronic systolic (congestive) heart failure: Secondary | ICD-10-CM | POA: Diagnosis present

## 2022-07-31 DIAGNOSIS — F0393 Unspecified dementia, unspecified severity, with mood disturbance: Secondary | ICD-10-CM | POA: Diagnosis present

## 2022-07-31 DIAGNOSIS — I110 Hypertensive heart disease with heart failure: Principal | ICD-10-CM | POA: Diagnosis present

## 2022-07-31 DIAGNOSIS — E871 Hypo-osmolality and hyponatremia: Secondary | ICD-10-CM | POA: Diagnosis present

## 2022-07-31 DIAGNOSIS — I442 Atrioventricular block, complete: Secondary | ICD-10-CM | POA: Diagnosis present

## 2022-07-31 DIAGNOSIS — N179 Acute kidney failure, unspecified: Secondary | ICD-10-CM

## 2022-07-31 DIAGNOSIS — I69354 Hemiplegia and hemiparesis following cerebral infarction affecting left non-dominant side: Secondary | ICD-10-CM

## 2022-07-31 DIAGNOSIS — F0394 Unspecified dementia, unspecified severity, with anxiety: Secondary | ICD-10-CM | POA: Diagnosis present

## 2022-07-31 DIAGNOSIS — Z803 Family history of malignant neoplasm of breast: Secondary | ICD-10-CM

## 2022-07-31 DIAGNOSIS — I5041 Acute combined systolic (congestive) and diastolic (congestive) heart failure: Secondary | ICD-10-CM | POA: Diagnosis not present

## 2022-07-31 DIAGNOSIS — I6930 Unspecified sequelae of cerebral infarction: Secondary | ICD-10-CM

## 2022-07-31 DIAGNOSIS — Z7982 Long term (current) use of aspirin: Secondary | ICD-10-CM | POA: Diagnosis not present

## 2022-07-31 DIAGNOSIS — R531 Weakness: Secondary | ICD-10-CM

## 2022-07-31 DIAGNOSIS — Z66 Do not resuscitate: Secondary | ICD-10-CM | POA: Diagnosis present

## 2022-07-31 DIAGNOSIS — R410 Disorientation, unspecified: Secondary | ICD-10-CM | POA: Diagnosis not present

## 2022-07-31 DIAGNOSIS — G9341 Metabolic encephalopathy: Secondary | ICD-10-CM | POA: Diagnosis present

## 2022-07-31 DIAGNOSIS — I10 Essential (primary) hypertension: Secondary | ICD-10-CM | POA: Diagnosis not present

## 2022-07-31 DIAGNOSIS — J9601 Acute respiratory failure with hypoxia: Secondary | ICD-10-CM | POA: Diagnosis not present

## 2022-07-31 DIAGNOSIS — I428 Other cardiomyopathies: Secondary | ICD-10-CM | POA: Diagnosis present

## 2022-07-31 DIAGNOSIS — Z950 Presence of cardiac pacemaker: Secondary | ICD-10-CM | POA: Diagnosis not present

## 2022-07-31 DIAGNOSIS — I69392 Facial weakness following cerebral infarction: Secondary | ICD-10-CM

## 2022-07-31 DIAGNOSIS — E785 Hyperlipidemia, unspecified: Secondary | ICD-10-CM | POA: Diagnosis present

## 2022-07-31 DIAGNOSIS — Z20822 Contact with and (suspected) exposure to covid-19: Secondary | ICD-10-CM | POA: Diagnosis present

## 2022-07-31 DIAGNOSIS — F32A Depression, unspecified: Secondary | ICD-10-CM | POA: Diagnosis present

## 2022-07-31 DIAGNOSIS — I447 Left bundle-branch block, unspecified: Secondary | ICD-10-CM | POA: Diagnosis not present

## 2022-07-31 DIAGNOSIS — I959 Hypotension, unspecified: Secondary | ICD-10-CM | POA: Diagnosis not present

## 2022-07-31 DIAGNOSIS — Z8572 Personal history of non-Hodgkin lymphomas: Secondary | ICD-10-CM | POA: Diagnosis not present

## 2022-07-31 DIAGNOSIS — Z7983 Long term (current) use of bisphosphonates: Secondary | ICD-10-CM

## 2022-07-31 DIAGNOSIS — I5022 Chronic systolic (congestive) heart failure: Secondary | ICD-10-CM | POA: Diagnosis present

## 2022-07-31 HISTORY — PX: RIGHT/LEFT HEART CATH AND CORONARY ANGIOGRAPHY: CATH118266

## 2022-07-31 LAB — POCT I-STAT EG7
Acid-Base Excess: 3 mmol/L — ABNORMAL HIGH (ref 0.0–2.0)
Acid-Base Excess: 4 mmol/L — ABNORMAL HIGH (ref 0.0–2.0)
Bicarbonate: 29.7 mmol/L — ABNORMAL HIGH (ref 20.0–28.0)
Bicarbonate: 29.8 mmol/L — ABNORMAL HIGH (ref 20.0–28.0)
Calcium, Ion: 1.21 mmol/L (ref 1.15–1.40)
Calcium, Ion: 1.23 mmol/L (ref 1.15–1.40)
HCT: 44 % (ref 36.0–46.0)
HCT: 44 % (ref 36.0–46.0)
Hemoglobin: 15 g/dL (ref 12.0–15.0)
Hemoglobin: 15 g/dL (ref 12.0–15.0)
O2 Saturation: 65 %
O2 Saturation: 66 %
Potassium: 3.7 mmol/L (ref 3.5–5.1)
Potassium: 3.7 mmol/L (ref 3.5–5.1)
Sodium: 136 mmol/L (ref 135–145)
Sodium: 137 mmol/L (ref 135–145)
TCO2: 31 mmol/L (ref 22–32)
TCO2: 31 mmol/L (ref 22–32)
pCO2, Ven: 50 mmHg (ref 44–60)
pCO2, Ven: 50 mmHg (ref 44–60)
pH, Ven: 7.382 (ref 7.25–7.43)
pH, Ven: 7.384 (ref 7.25–7.43)
pO2, Ven: 35 mmHg (ref 32–45)
pO2, Ven: 36 mmHg (ref 32–45)

## 2022-07-31 LAB — VITAMIN B12: Vitamin B-12: 708 pg/mL (ref 180–914)

## 2022-07-31 LAB — RESPIRATORY PANEL BY PCR

## 2022-07-31 LAB — CBC WITH DIFFERENTIAL/PLATELET
Abs Immature Granulocytes: 0.07 10*3/uL (ref 0.00–0.07)
Basophils Absolute: 0.1 10*3/uL (ref 0.0–0.1)
Basophils Relative: 1 %
Eosinophils Absolute: 0.1 10*3/uL (ref 0.0–0.5)
Eosinophils Relative: 1 %
HCT: 41.5 % (ref 36.0–46.0)
Hemoglobin: 13.6 g/dL (ref 12.0–15.0)
Immature Granulocytes: 1 %
Lymphocytes Relative: 22 %
Lymphs Abs: 2.1 10*3/uL (ref 0.7–4.0)
MCH: 29.7 pg (ref 26.0–34.0)
MCHC: 32.8 g/dL (ref 30.0–36.0)
MCV: 90.6 fL (ref 80.0–100.0)
Monocytes Absolute: 0.8 10*3/uL (ref 0.1–1.0)
Monocytes Relative: 8 %
Neutro Abs: 6.7 10*3/uL (ref 1.7–7.7)
Neutrophils Relative %: 67 %
Platelets: 283 10*3/uL (ref 150–400)
RBC: 4.58 MIL/uL (ref 3.87–5.11)
RDW: 14.4 % (ref 11.5–15.5)
WBC: 9.8 10*3/uL (ref 4.0–10.5)
nRBC: 0 % (ref 0.0–0.2)

## 2022-07-31 LAB — URINALYSIS, ROUTINE W REFLEX MICROSCOPIC
Bacteria, UA: NONE SEEN
Bilirubin Urine: NEGATIVE
Glucose, UA: >=500 mg/dL — AB
Hgb urine dipstick: NEGATIVE
Ketones, ur: NEGATIVE mg/dL
Nitrite: NEGATIVE
Protein, ur: NEGATIVE mg/dL
Specific Gravity, Urine: 1.032 — ABNORMAL HIGH (ref 1.005–1.030)
pH: 6 (ref 5.0–8.0)

## 2022-07-31 LAB — COMPREHENSIVE METABOLIC PANEL
ALT: 15 U/L (ref 0–44)
AST: 24 U/L (ref 15–41)
Albumin: 3.3 g/dL — ABNORMAL LOW (ref 3.5–5.0)
Alkaline Phosphatase: 54 U/L (ref 38–126)
Anion gap: 7 (ref 5–15)
BUN: 23 mg/dL (ref 8–23)
CO2: 28 mmol/L (ref 22–32)
Calcium: 8.5 mg/dL — ABNORMAL LOW (ref 8.9–10.3)
Chloride: 100 mmol/L (ref 98–111)
Creatinine, Ser: 0.82 mg/dL (ref 0.44–1.00)
GFR, Estimated: >60 mL/min (ref >60)
Glucose, Bld: 110 mg/dL — ABNORMAL HIGH (ref 70–99)
Potassium: 3.7 mmol/L (ref 3.5–5.1)
Sodium: 135 mmol/L (ref 135–145)
Total Bilirubin: 0.7 mg/dL (ref 0.3–1.2)
Total Protein: 6.3 g/dL — ABNORMAL LOW (ref 6.5–8.1)

## 2022-07-31 LAB — POCT I-STAT 7, (LYTES, BLD GAS, ICA,H+H)
Acid-Base Excess: 2 mmol/L (ref 0.0–2.0)
Bicarbonate: 27.6 mmol/L (ref 20.0–28.0)
Calcium, Ion: 1.17 mmol/L (ref 1.15–1.40)
HCT: 42 % (ref 36.0–46.0)
Hemoglobin: 14.3 g/dL (ref 12.0–15.0)
O2 Saturation: 93 %
Potassium: 3.6 mmol/L (ref 3.5–5.1)
Sodium: 138 mmol/L (ref 135–145)
TCO2: 29 mmol/L (ref 22–32)
pCO2 arterial: 48.1 mmHg — ABNORMAL HIGH (ref 32–48)
pH, Arterial: 7.367 (ref 7.35–7.45)
pO2, Arterial: 70 mmHg — ABNORMAL LOW (ref 83–108)

## 2022-07-31 LAB — AMMONIA: Ammonia: <10 umol/L (ref 9–35)

## 2022-07-31 LAB — BRAIN NATRIURETIC PEPTIDE: B Natriuretic Peptide: 702.2 pg/mL — ABNORMAL HIGH (ref 0.0–100.0)

## 2022-07-31 LAB — TSH: TSH: 4.185 u[IU]/mL (ref 0.350–4.500)

## 2022-07-31 SURGERY — RIGHT/LEFT HEART CATH AND CORONARY ANGIOGRAPHY
Anesthesia: LOCAL

## 2022-07-31 MED ORDER — MIDODRINE HCL 2.5 MG PO TABS
2.5000 mg | ORAL_TABLET | Freq: Three times a day (TID) | ORAL | Status: DC
  Administered 2022-07-31 – 2022-08-03 (×8): 2.5 mg via ORAL
  Filled 2022-07-31 (×10): qty 1

## 2022-07-31 MED ORDER — FENTANYL CITRATE (PF) 100 MCG/2ML IJ SOLN
INTRAMUSCULAR | Status: DC | PRN
  Administered 2022-07-31: 25 ug via INTRAVENOUS

## 2022-07-31 MED ORDER — LIDOCAINE HCL (PF) 1 % IJ SOLN
INTRAMUSCULAR | Status: DC | PRN
  Administered 2022-07-31: 5 mL

## 2022-07-31 MED ORDER — IOHEXOL 350 MG/ML SOLN
INTRAVENOUS | Status: DC | PRN
  Administered 2022-07-31: 75 mL

## 2022-07-31 MED ORDER — HEPARIN SODIUM (PORCINE) 5000 UNIT/ML IJ SOLN
5000.0000 [IU] | Freq: Three times a day (TID) | INTRAMUSCULAR | Status: DC
  Administered 2022-07-31 – 2022-08-03 (×8): 5000 [IU] via SUBCUTANEOUS
  Filled 2022-07-31 (×9): qty 1

## 2022-07-31 MED ORDER — ONDANSETRON HCL 4 MG/2ML IJ SOLN
4.0000 mg | Freq: Four times a day (QID) | INTRAMUSCULAR | Status: DC | PRN
  Administered 2022-07-31: 4 mg via INTRAVENOUS
  Filled 2022-07-31: qty 2

## 2022-07-31 MED ORDER — SODIUM CHLORIDE 0.9 % IV BOLUS
250.0000 mL | Freq: Once | INTRAVENOUS | Status: AC
  Administered 2022-07-31: 250 mL via INTRAVENOUS

## 2022-07-31 MED ORDER — SODIUM CHLORIDE 0.9 % IV BOLUS: 250.0000 mL | Freq: Once | INTRAVENOUS | Status: DC

## 2022-07-31 MED ORDER — HEPARIN SODIUM (PORCINE) 1000 UNIT/ML IJ SOLN
INTRAMUSCULAR | Status: DC | PRN
  Administered 2022-07-31: 3000 [IU] via INTRAVENOUS

## 2022-07-31 MED ORDER — SODIUM CHLORIDE 0.9% FLUSH
3.0000 mL | Freq: Two times a day (BID) | INTRAVENOUS | Status: DC
  Administered 2022-07-31 – 2022-08-03 (×6): 3 mL via INTRAVENOUS

## 2022-07-31 MED ORDER — ACETAMINOPHEN 325 MG PO TABS: 650.0000 mg | ORAL_TABLET | ORAL | Status: DC | PRN

## 2022-07-31 MED ORDER — SERTRALINE HCL 50 MG PO TABS
50.0000 mg | ORAL_TABLET | Freq: Every day | ORAL | Status: DC
  Administered 2022-07-31 – 2022-08-03 (×4): 50 mg via ORAL
  Filled 2022-07-31 (×4): qty 1

## 2022-07-31 MED ORDER — ALBUTEROL SULFATE (2.5 MG/3ML) 0.083% IN NEBU: 2.5000 mg | INHALATION_SOLUTION | Freq: Four times a day (QID) | RESPIRATORY_TRACT | Status: DC | PRN

## 2022-07-31 MED ORDER — HEPARIN (PORCINE) IN NACL 1000-0.9 UT/500ML-% IV SOLN
INTRAVENOUS | Status: AC
  Filled 2022-07-31: qty 1000

## 2022-07-31 MED ORDER — SODIUM CHLORIDE 0.9% FLUSH: 3.0000 mL | INTRAVENOUS | Status: DC | PRN

## 2022-07-31 MED ORDER — HEPARIN SODIUM (PORCINE) 1000 UNIT/ML IJ SOLN
INTRAMUSCULAR | Status: AC
  Filled 2022-07-31: qty 10

## 2022-07-31 MED ORDER — LIDOCAINE HCL (PF) 1 % IJ SOLN
INTRAMUSCULAR | Status: AC
  Filled 2022-07-31: qty 30

## 2022-07-31 MED ORDER — FENTANYL CITRATE (PF) 100 MCG/2ML IJ SOLN
INTRAMUSCULAR | Status: AC
  Filled 2022-07-31: qty 2

## 2022-07-31 MED ORDER — ASPIRIN 81 MG PO CHEW
81.0000 mg | CHEWABLE_TABLET | Freq: Every day | ORAL | Status: DC
  Administered 2022-08-01 – 2022-08-03 (×3): 81 mg via ORAL
  Filled 2022-07-31 (×3): qty 1

## 2022-07-31 MED ORDER — VERAPAMIL HCL 2.5 MG/ML IV SOLN
INTRAVENOUS | Status: AC
  Filled 2022-07-31: qty 2

## 2022-07-31 MED ORDER — SODIUM CHLORIDE 0.9 % IV SOLN: 250.0000 mL | INTRAVENOUS | Status: DC | PRN

## 2022-07-31 MED ORDER — HEPARIN (PORCINE) IN NACL 1000-0.9 UT/500ML-% IV SOLN
INTRAVENOUS | Status: DC | PRN
  Administered 2022-07-31 (×2): 500 mL

## 2022-07-31 MED ORDER — SODIUM CHLORIDE 0.9 % IV BOLUS
INTRAVENOUS | Status: AC | PRN
  Administered 2022-07-31: 250 mL via INTRAVENOUS

## 2022-07-31 MED ORDER — VERAPAMIL HCL 2.5 MG/ML IV SOLN
INTRAVENOUS | Status: DC | PRN
  Administered 2022-07-31: 10 mL via INTRA_ARTERIAL

## 2022-07-31 SURGICAL SUPPLY — 18 items
BAND ZEPHYR COMPRESS 30 LONG (HEMOSTASIS) IMPLANT
CATH BALLN WEDGE 5F 110CM (CATHETERS) IMPLANT
CATH INFINITI 5 FR 3DRC (CATHETERS) IMPLANT
CATH INFINITI 5 FR AR1 MOD (CATHETERS) IMPLANT
CATH INFINITI 5FR AL1 (CATHETERS) IMPLANT
CATH INFINITI 5FR JL4 (CATHETERS) IMPLANT
CATH INFINITI 5FR JL5 (CATHETERS) IMPLANT
CATH INFINITI JR4 5F (CATHETERS) IMPLANT
CATH OPTITORQUE TIG 4.0 5F (CATHETERS) IMPLANT
GLIDESHEATH SLEND A-KIT 6F 22G (SHEATH) IMPLANT
GLIDESHEATH SLEND SS 6F .021 (SHEATH) IMPLANT
GUIDEWIRE INQWIRE 1.5J.035X260 (WIRE) IMPLANT
INQWIRE 1.5J .035X260CM (WIRE) ×1
PACK CARDIAC CATHETERIZATION (CUSTOM PROCEDURE TRAY) ×1 IMPLANT
SHEATH GLIDE SLENDER 4/5FR (SHEATH) IMPLANT
SHEATH PROBE COVER 6X72 (BAG) IMPLANT
TRANSDUCER W/STOPCOCK (MISCELLANEOUS) ×1 IMPLANT
WIRE HI TORQ VERSACORE-J 145CM (WIRE) IMPLANT

## 2022-07-31 NOTE — H&P (Signed)
   88 y/o woman with previous CVA, LBBB and symptomatic bradycardia due to CHB s/p previous dual-chamber PM  Admitted to [facility] with acute systolic HF with EF 12%.   Denies CP. + SOB  General:  Elderly . No resp difficulty HEENT: normal Neck: supple. JVP 7. Carotids 2+ bilat; no bruits. No lymphadenopathy or thryomegaly appreciated. Cor: PMI nondisplaced. Regular rate & rhythm. No rubs, gallops or murmurs. Lungs: clear Abdomen: soft, nontender, nondistended. No hepatosplenomegaly. No bruits or masses. Good bowel sounds. Extremities: no cyanosis, clubbing, rash, edema Neuro: alert & orientedx3, cranial nerves grossly intact. Mild left-sided weakness. Affect pleasant  New onset systolic HF. Suspect possible related to LBBB but also with multiple CRFs.   Plan R/L cath. Interrogate device to assess percentage of RV pacing. EP to see to consdier CRT as needed.   Glori Bickers, MD  1:53 PM

## 2022-07-31 NOTE — Assessment & Plan Note (Addendum)
Echocardiogram with reduced LV systolic function 30 to 81%. 10/04 Cardiac catheterization RA 2  RV 23/2 PA 19/6 with mean 14 PCWP 5  Cardiac output 3,6 and index. 2,3 Fick.  PVR 2,5  No significant coronary artery disease.   Patient required IV fluids bolus due to hypotension  Continue medical therapy with  Midodrine Hold on RAS inh due to risk of hypotension.

## 2022-07-31 NOTE — Assessment & Plan Note (Signed)
88 year old who presented to [facility] for shortness of breath x 2 weeks and found to have new heart failure with EF of 30-35% and was transferred to [facility] where she underwent LHC that showed normal coronary arteries. She had episodes of intermittent confusion and we were asked to consult.  -alert and oriented except for place with normal and appropriate conversation -no new focal deficits -no infectious signs or symptoms. Afebrile, no leukocytosis, normal lactic acid, UA clear at [place] and [facility] were NTD on chart.  -will check metabolic labs -neuro checks -repeat UA  -delirium precautions

## 2022-07-31 NOTE — H&P (Addendum)
Advanced Heart Failure Team History and Physical Note   PCP:  Marco Collie, MD  PCP-Cardiology: None    AHF MD: Hebert Soho, MD  Reason for Admission: New Acute systolic heart failure   HPI:   Bethany Nichols is seen today for evaluation of new acute systolic heart failure at the request of Dr. Maxwell Caul, [facility] emergency medicine.   Bethany Nichols is a 88y.o. female with PMH of stroke w/ L sided weakness, lymphoma 01' anxiety, HTN, HLP, and LBBB>CHB s/p st jude dual-chamber PM  6/20' and dementia.   Saw Dr. Curt Bears, last 5/23, for CHB f/u. Got PM implanted 6/20 after having 7 sec pauses on Zio. Overall felt like she was well during this visit.    10/2 Presented to [facility] ED w/ complaints of a couple of days of worsening SOB and cough. Felt dizzy, got super SOB and passed out (per patient). In the ED proBNP elevated at 7660, CXR w/ no signs of edema, normal heart size. Procal (-), afebrile, HsTrop (-). Breathing was stable, didn't look volume overload. Received '40mg'$  IV lasix. Decision to tx to [facility] for further w/u by AHF team.   At home she gets around with a walker. Lives alone. Denies issues prior to recent. Some SOB with conversation. Quit smoking >61yr ago. Denies CP and edema.   Cardiac studies reviewed:  [facility] EF 30-35%, LV with impaired relaxation pattern, mild concentric hypertrophy of LV. Mild MR and TR, PASP 24 Echo 6/20: EF 60-65%, RV norm sys function, trivial MR and TR, RVSP mildly elevated at 38.7.   Review of Systems: [y] = yes, '[ ]'$  = no   General: Weight gain '[ ]'$ ; Weight loss '[ ]'$ ; Anorexia '[ ]'$ ; Fatigue [Y ]; Fever '[ ]'$ ; Chills '[ ]'$ ; Weakness '[ ]'$   Cardiac: Chest pain/pressure '[ ]'$ ; Resting SOB [ Y]; Exertional SOB [ Y]; Orthopnea '[ ]'$ ; Pedal Edema '[ ]'$ ; Palpitations '[ ]'$ ; Syncope '[ ]'$ ; Presyncope '[ ]'$ ; Paroxysmal nocturnal dyspnea'[ ]'$   Pulmonary: Cough [Y ]; Wheezing[Y ]; Hemoptysis'[ ]'$ ; Sputum '[ ]'$ ; Snoring '[ ]'$   GI: Vomiting'[ ]'$ ; Dysphagia'[ ]'$ ;  Melena'[ ]'$ ; Hematochezia '[ ]'$ ; Heartburn'[ ]'$ ; Abdominal pain '[ ]'$ ; Constipation '[ ]'$ ; Diarrhea '[ ]'$ ; BRBPR '[ ]'$   GU: Hematuria'[ ]'$ ; Dysuria '[ ]'$ ; Nocturia'[ ]'$   Vascular: Pain in legs with walking '[ ]'$ ; Pain in feet with lying flat '[ ]'$ ; Non-healing sores '[ ]'$ ; Stroke '[ ]'$ ; TIA '[ ]'$ ; Slurred speech '[ ]'$ ;  Neuro: Headaches'[ ]'$ ; Vertigo'[ ]'$ ; Seizures'[ ]'$ ; Paresthesias'[ ]'$ ;Blurred vision '[ ]'$ ; Diplopia '[ ]'$ ; Vision changes '[ ]'$   Ortho/Skin: Arthritis '[ ]'$ ; Joint pain '[ ]'$ ; Muscle pain '[ ]'$ ; Joint swelling '[ ]'$ ; Back Pain '[ ]'$ ; Rash '[ ]'$   Psych: Depression'[ ]'$ ; Anxiety[ Y]  Heme: Bleeding problems '[ ]'$ ; Clotting disorders '[ ]'$ ; Anemia '[ ]'$   Endocrine: Diabetes '[ ]'$ ; Thyroid dysfunction'[ ]'$    Home Medications Prior to Admission medications   Medication Sig Start Date End Date Taking? Authorizing Provider  acetaminophen (TYLENOL) 650 MG CR tablet Take 650 mg by mouth every 12 (twelve) hours as needed for pain.    [provider]  alendronate (FOSAMAX) 70 MG tablet Take 70 mg by mouth once a week. Take with a full glass of water on an empty stomach.    [provider]  aspirin EC 81 MG tablet Take 81 mg by mouth daily.    [provider]  b complex vitamins tablet Take  1 tablet by mouth daily.    [provider]  calcium carbonate (OSCAL) 1500 (600 Ca) MG TABS tablet Take 600 mg of elemental calcium by mouth 2 (two) times daily with a meal.    [provider]  calcium carbonate (TUMS - DOSED IN MG ELEMENTAL CALCIUM) 500 MG chewable tablet Chew 1 tablet by mouth 2 (two) times daily as needed for indigestion or heartburn.    [provider]  cetaphil (CETAPHIL) cream Apply 1 application topically 2 (two) times daily as needed (dry skin).    [provider]  Menthol-Methyl Salicylate (MUSCLE RUB) 10-15 % CREA Apply 1 application topically 3 (three) times daily as needed for muscle pain.    [provider]  sertraline (ZOLOFT) 50 MG tablet Take 50 mg by mouth daily.     [provider]  vitamin E 1000 UNIT capsule Take 1,000 Units by mouth daily.    [provider]    Past Medical History: Past Medical History:  Diagnosis Date   Anxiety    Complete heart block ([place])    CVA (cerebral vascular accident) ([place])    Depression    Hyperlipidemia    Hypertension     Past Surgical History: Past Surgical History:  Procedure Laterality Date   PACEMAKER IMPLANT N/A 04/16/2019   Procedure: PACEMAKER IMPLANT;  Surgeon: Constance Haw, MD;  Location: [place] CV LAB;  Service: Cardiovascular;  Laterality: N/A;    Family History:  Family History  Problem Relation Age of Onset   Breast cancer Mother     Social History: Social History   Socioeconomic History   Marital status: Divorced    Spouse name: Not on file   Number of children: Not on file   Years of education: Not on file   Highest education level: Not on file  Occupational History   Not on file  Tobacco Use   Smoking status: Never   Smokeless tobacco: Never  Vaping Use   Vaping Use: Unknown  Substance and Sexual Activity   Alcohol use: Not on file   Drug use: Not on file   Sexual activity: Not on file  Other Topics Concern   Not on file  Social History Narrative   Not on file   Social Determinants of Health   Financial Resource Strain: Not on file  Food Insecurity: Not on file  Transportation Needs: Not on file  Physical Activity: Not on file  Stress: Not on file  Social Connections: Not on file    Allergies:  Allergies  Allergen Reactions   Levofloxacin Rash   Allegra [Fexofenadine] Other (See Comments)    unknown    Objective:    Vital Signs:   SpO2:  [94 %] 94 % (10/03 1349)   There were no vitals filed for this visit.   Physical Exam     General:  well, elderly appearing. Some SOB w/ conversation HEENT: normal Neck: supple. No JVD. Carotids 2+ bilat; no bruits. No lymphadenopathy or thyromegaly appreciated. Cor: PMI nondisplaced.  Regular rate & rhythm. No rubs, gallops or murmurs. Lungs: clear Abdomen: soft, nontender, nondistended. No hepatosplenomegaly. No bruits or masses. Good bowel sounds. Extremities: no cyanosis, clubbing, rash, edema  Neuro: alert & oriented x 3, cranial nerves grossly intact. moves all 4 extremities w/o difficulty. Affect pleasant.   Telemetry   80s SR (Personally reviewed)    EKG   Possible ectopic atrial rhythm w/ PACs, left axis deviation, LBBB 63 bpm 07/30/22 (Personally  reviewed)    Labs     Basic Metabolic Panel: No results for input(s): "NA", "K", "CL", "CO2", "GLUCOSE", "BUN", "CREATININE", "CALCIUM", "MG", "PHOS" in the last 168 hours.  Liver Function Tests: No results for input(s): "AST", "ALT", "ALKPHOS", "BILITOT", "PROT", "ALBUMIN" in the last 168 hours. No results for input(s): "LIPASE", "AMYLASE" in the last 168 hours. No results for input(s): "AMMONIA" in the last 168 hours.  CBC: No results for input(s): "WBC", "NEUTROABS", "HGB", "HCT", "MCV", "PLT" in the last 168 hours.  Cardiac Enzymes: No results for input(s): "CKTOTAL", "CKMB", "CKMBINDEX", "TROPONINI" in the last 168 hours.  BNP: BNP (last 3 results) No results for input(s): "BNP" in the last 8760 hours.  ProBNP (last 3 results) No results for input(s): "PROBNP" in the last 8760 hours.   CBG: No results for input(s): "GLUCAP" in the last 168 hours.  Coagulation Studies: No results for input(s): "LABPROT", "INR" in the last 72 hours.  Imaging: No results found.   Patient Profile  Ms. Signer is a 88y.o. female with PMH of stroke w/ L sided weakness, anxiety, HTN, HLP, and LBBB>CHB s/p saint jude dual-chamber ICD 6/20'. Transferred to [facility] from [facility] hospital after new findings of acute systolic heart failure. AHF team asked to see for further evaluation  Assessment/Plan   Acute systolic heart failure - Echo 6/20: EF 60-65%, RV norm sys function, trivial MR and TR, RVSP mildly elevated  at 38.7.  - Echo at [facility] 10/2: EF 30-35%, LV with impaired relaxation pattern, mild concentric hypertrophy of LV. Mild MR and TR, PASP 24 - Initially suspected 2/2 LBBB, however only 1% burden RV pacing on device interrogation. Cannot r/o ischemic as she has multiple CRF, going for [facility] today - Presented NYHA IV, SOB at rest. No previous history of HF S&S. Given 40 mg IV lasix at OSH, reassess diuretics after cath. - L/RHC today to access coronaries and R sided pressures/hemodynamics's - may need cMRI - Check CBC, CMET, BNP - Begin GDMT as tolerated - Strict I&O, daily weights  Intermittent complete heart block, LBBB - Status post Saint Jude dual-chamber pacemaker implanted 6/22  - Device interrogation revealed: AP 18%, VP 1.2%, no AT/AF, 43 PMT episodes since May  HTN - controlled, follow and adjust GDMT as tolerated  HLD - Needs lipid panel  Earnie Larsson, AGACNP-BC  07/31/2022, 2:40 PM  Advanced Heart Failure Team Pager [phone] (M-F; 7a - 5p)  Please contact [facility] for night-coverage after hours (4p -7a ) and weekends on [email or web address]  Patient seen and examined with the above-signed Advanced Practice Provider and/or Housestaff. I personally reviewed laboratory data, imaging studies and relevant notes. I independently examined the patient and formulated the important aspects of the plan. I have edited the note to reflect any of my changes or salient points. I have personally discussed the plan with the patient and/or family.  88 y/o woman with previous CVA, LBBB and symptomatic bradycardia due to CHB s/p previous dual-chamber PM   Admitted to [facility] with acute systolic HF with EF 02%.    Denies CP. + SOB   General:  Elderly . No resp difficulty HEENT: normal Neck: supple. JVP flat. Carotids 2+ bilat; no bruits. No lymphadenopathy or thryomegaly appreciated. Cor: PMI nondisplaced. Regular rate & rhythm. No rubs, gallops or murmurs. Lungs: clear Abdomen: soft,  nontender, nondistended. No hepatosplenomegaly. No bruits or masses. Good bowel sounds. Extremities: no cyanosis, clubbing, rash, edema Neuro: alert. Mildly confused cranial nerves grossly intact. Mild left-sided weakness.  Affect pleasant   New onset systolic HF. Suspect possible related to LBBB but also with multiple CRFs.    Plan R/L cath. Interrogate device to assess percentage of RV pacing. EP to see to consdier CRT as needed.   Glori Bickers, MD

## 2022-07-31 NOTE — Consult Note (Signed)
Initial Consultation Note   Patient: Bethany Nichols TIR:[id number] DOB: 12-Feb-1933 PCP: Marco Collie, MD DOA: 07/31/2022 DOS: the patient was seen and examined on 07/31/2022 Primary service: Hebert Soho, DO  Referring physician: Dr. Haroldine Laws Reason for consult: intermittent confusion   Assessment/Plan: Assessment and Plan: * Acute systolic CHF (congestive heart failure) ([place]) 88 year old presenting with new onset CHF with eF of 30-35% transferred to [facility] from [facility] today with normal coronary arteries  -strict I/O -check RVP, covid/flu negative -plan Per cardiology team   Intermittent confusion 88 year old who presented to [facility] for shortness of breath x 2 weeks and found to have new heart failure with EF of 30-35% and was transferred to [facility] where she underwent LHC that showed normal coronary arteries. She had episodes of intermittent confusion and we were asked to consult.  -alert and oriented except for place with normal and appropriate conversation -no new focal deficits -no infectious signs or symptoms. Afebrile, no leukocytosis, normal lactic acid, UA clear at [place] and BC were NTD on chart.  -will check metabolic labs -neuro checks -repeat UA  -delirium precautions   Acute respiratory failure with hypoxia (HCC) Unsure oxygenation, but per paper chart new hypoxia requiring 2L oxygen  No history of COPD/wheezing on exam  Responding well to IV lasix and likely secondary to new CHF Will also check RVP with URI type symptoms x 2 weeks. Covid/flu negative PCT wnl at [place], CXR with no acute finding except changes consistent with chronic bronchitis.  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 where she is from  Will do neuro checks Continue medical management   Weakness Will check TSH/B12 Will have PT to see her to make sure she doesn't need HH  Depression and anxiety Continue zoloft   Complete AV block s/p PPM  Noted     TRH will continue to follow the patient.  HPI: Bethany Nichols is a 88 y.o. female with past medical history of CHB s/p PPM, hx of CVA, HTN, HLD, depression and anxiety who presented to [facility] ED with complaints of 2-3 days of worsening shortness of breath and cough. She tells me that she started to have respiratory symptoms about 2 weeks ago with a sore throat and a cough. Her cough was productive and she feels more short of breath with a cough. She was given a steroid taper by her primary care and thinks she is done or has one more day of this. She attributed her shortness of breath to the cough. The morning she went to ED she was ery short of breath after having several coughing spells and felt like she was going to pass out and couldn't breath so she pressed her emergency button.    Denies any fever/chills, vision changes/headaches, chest pain or palpitations, abdominal pain, N/V/D, dysuria or leg swelling. She denies weight gain or orthopnea.   At baseline she uses a walker and is independent with her ADLs. Lives alone.   Labs significant for bnp 7660, CXR with no signs of edema and normal heart size, PCT negative, troponin wnl. No oxygen requirement. Echo at [facility] showed EF of 30-35%. LV with impaired relaxation. She was transferred to [facility] for  further w/u by AHF team. She underwent cardiac catheterization today which found no significant coronary artery disease. She has episodes of intermittent confusion and we were asked to admit.   Review of Systems: As mentioned in the history of present illness. All other systems reviewed and are negative. Past Medical History:  Diagnosis Date   Anxiety    Complete heart block (HCC)    CVA  (cerebral vascular accident) ([place])    Depression    Hyperlipidemia    Hypertension    Past Surgical History:  Procedure Laterality Date   PACEMAKER IMPLANT N/A 04/16/2019   Procedure: PACEMAKER IMPLANT;  Surgeon: Constance Haw, MD;  Location: [place] [facility];  Service: Cardiovascular;  Laterality: N/A;   Social History:  reports that she has never smoked. She has never used smokeless tobacco. No history on file for alcohol use and drug use.  Allergies  Allergen Reactions   Levofloxacin Rash   Allegra [Fexofenadine] Other (See Comments)    unknown    Family History  Problem Relation Age of Onset   Breast cancer Mother     Prior to Admission medications   Medication Sig Start Date End Date Taking? Authorizing Provider  acetaminophen (TYLENOL) 650 MG CR tablet Take 650 mg by mouth every 12 (twelve) hours as needed for pain.    [provider]  alendronate (FOSAMAX) 70 MG tablet Take 70 mg by mouth once a week. Take with a full glass of water on an empty stomach.    [provider]  aspirin EC 81 MG tablet Take 81 mg by mouth daily.    [provider]  b complex vitamins tablet Take 1 tablet by mouth daily.    [provider]  calcium carbonate (OSCAL) 1500 (600 Ca) MG TABS tablet Take 600 mg of elemental calcium by mouth 2 (two) times daily with a meal.    [provider]  calcium carbonate (TUMS - DOSED IN MG ELEMENTAL CALCIUM) 500 MG chewable tablet Chew 1 tablet by mouth 2 (two) times daily as needed for indigestion or heartburn.    [provider]  cetaphil (CETAPHIL) cream Apply 1 application topically 2 (two) times daily as needed (dry skin).    [provider]  Menthol-Methyl Salicylate (MUSCLE RUB) 10-15 % CREA Apply 1 application topically 3 (three) times daily as needed for muscle pain.    [provider]  sertraline (ZOLOFT) 50 MG tablet Take 50 mg by mouth daily.    [provider]   vitamin E 1000 UNIT capsule Take 1,000 Units by mouth daily.    [provider]    Physical Exam: Vitals:   07/31/22 1625 07/31/22 1640 07/31/22 1700 07/31/22 1716  BP: (!) 84/52 (!) 84/53 (!) 89/53 (!) 89/49  Pulse: (!) 59 70 60 61  Resp: '14 19 14 18  '$ Temp:    97.6 F (36.4 C)  TempSrc:    Oral  SpO2: 92% 93% 95% 90%  Weight:    60.5 kg  Height:    '5\' 1"'$  (1.549 m)   Physical Exam Vitals reviewed.  Constitutional:      Appearance: Normal appearance. She is normal weight.  HENT:     Head: Normocephalic and atraumatic.  Cardiovascular:     Rate and Rhythm: Normal rate and regular rhythm.     Heart sounds: Normal heart sounds.  Pulmonary:     Effort: Pulmonary effort is normal.     Breath sounds: Normal breath sounds.  No wheezing.  Abdominal:     General: Abdomen is flat. Bowel sounds are normal.     Palpations: Abdomen is soft.  Musculoskeletal:     Cervical back: Normal range of motion and neck supple.  Skin:    General: Skin is warm.  Neurological:     Mental Status: She is alert.     Comments: Left sided facial droop at baseline, no other focal deficits  Oriented x 2  Psychiatric:        Mood and Affect: Mood normal.        Behavior: Behavior normal.     Data Reviewed:  Cbc and cmp unremarkable. Urine: negative nitrite, negative WBC, wbc <1 Covid and flu negative  Reviewed, pertinent per above      Family Communication: updated son, Joey by phone. Confirmed she is DNR.  Primary team communication: n/a  Thank you very much for involving [place] in the care of your patient.  Author: Orma Flaming, MD 07/31/2022 5:37 PM  For on call review [email or web address].

## 2022-07-31 NOTE — Plan of Care (Signed)
  Problem: Education: Goal: Individualized Educational Video(s) Outcome: Progressing   Problem: Activity: Goal: Ability to return to baseline activity level will improve Outcome: Progressing   Problem: Cardiovascular: Goal: Vascular access site(s) Level 0-1 will be maintained Outcome: Progressing

## 2022-07-31 NOTE — Assessment & Plan Note (Addendum)
No clinical signs of acute CVA Plan to continue aspirin Patient not on statin

## 2022-07-31 NOTE — Assessment & Plan Note (Signed)
Pacemaker in place

## 2022-07-31 NOTE — Assessment & Plan Note (Signed)
Unsure oxygenation, but per paper chart new hypoxia requiring 2L oxygen  No history of COPD/wheezing on exam  Responding well to IV lasix and likely secondary to new CHF Will also check RVP with URI type symptoms x 2 weeks. Covid/flu negative PCT wnl at [place], CXR with no acute finding except changes consistent with chronic bronchitis.  SABA prn

## 2022-07-31 NOTE — Interval H&P Note (Signed)
History and Physical Interval Note:  07/31/2022 1:54 PM  Bethany Nichols  has presented today for surgery, with the diagnosis of nstemi.  The various methods of treatment have been discussed with the patient and family. After consideration of risks, benefits and other options for treatment, the patient has consented to  Procedure(s): RIGHT/LEFT HEART CATH AND CORONARY ANGIOGRAPHY (N/A) and possible coronary angioplasty as a surgical intervention.  The patient's history has been reviewed, patient examined, no change in status, stable for surgery.  I have reviewed the patient's chart and labs.  Questions were answered to the patient's satisfaction.     Christe Tellez

## 2022-07-31 NOTE — Assessment & Plan Note (Signed)
Will check TSH/B12 Will have PT to see her to make sure she doesn't need HH

## 2022-07-31 NOTE — Assessment & Plan Note (Signed)
-

## 2022-08-01 ENCOUNTER — Encounter (HOSPITAL_COMMUNITY): Payer: Self-pay | Admitting: Internal Medicine

## 2022-08-01 DIAGNOSIS — I442 Atrioventricular block, complete: Secondary | ICD-10-CM

## 2022-08-01 DIAGNOSIS — I6930 Unspecified sequelae of cerebral infarction: Secondary | ICD-10-CM

## 2022-08-01 DIAGNOSIS — J9601 Acute respiratory failure with hypoxia: Secondary | ICD-10-CM | POA: Diagnosis not present

## 2022-08-01 DIAGNOSIS — R410 Disorientation, unspecified: Secondary | ICD-10-CM

## 2022-08-01 DIAGNOSIS — I5021 Acute systolic (congestive) heart failure: Secondary | ICD-10-CM | POA: Diagnosis not present

## 2022-08-01 LAB — BASIC METABOLIC PANEL
Anion gap: 9 (ref 5–15)
BUN: 21 mg/dL (ref 8–23)
CO2: 30 mmol/L (ref 22–32)
Calcium: 8.8 mg/dL — ABNORMAL LOW (ref 8.9–10.3)
Chloride: 98 mmol/L (ref 98–111)
Creatinine, Ser: 0.88 mg/dL (ref 0.44–1.00)
GFR, Estimated: >60 mL/min (ref >60)
Glucose, Bld: 107 mg/dL — ABNORMAL HIGH (ref 70–99)
Potassium: 3.6 mmol/L (ref 3.5–5.1)
Sodium: 137 mmol/L (ref 135–145)

## 2022-08-01 MED ORDER — POTASSIUM CHLORIDE CRYS ER 20 MEQ PO TBCR
40.0000 meq | EXTENDED_RELEASE_TABLET | Freq: Once | ORAL | Status: AC
  Administered 2022-08-01: 40 meq via ORAL
  Filled 2022-08-01: qty 2

## 2022-08-01 NOTE — NC FL2 (Signed)
[place] LEVEL OF CARE SCREENING TOOL     IDENTIFICATION  Patient Name: Bethany Nichols Birthdate: Jun 02, 1933 Sex: female Admission Date (Current Location): 07/31/2022  [facility] and [place] Number:  Herbalist and Address:  [facility]. [facility], [place] [address] [place], [place] [address]      Provider Number: [id number]  Attending Physician Name and Address:  Hebert Soho, DO  Relative Name and Phone Number:  Giara, [place])   [phone] [facility] Phone)    Current Level of Care: Hospital Recommended Level of Care: [place] Prior Approval Number:    Date Approved/Denied:   PASRR Number: [id number]  Discharge Plan: SNF    Current Diagnoses: Patient Active Problem List   Diagnosis Date Noted   Acute systolic CHF (congestive heart failure) ([place]) 07/31/2022   Depression and anxiety 07/31/2022   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 07/31/2022   Intermittent confusion 07/31/2022   Acute respiratory failure with hypoxia ([place]) 07/31/2022   Complete AV block s/p PPM  04/16/2019   Bradycardia 04/08/2019    Orientation RESPIRATION BLADDER Height & Weight     Self, Situation, Time, Place  O2 (2L [place]) External catheter Weight: 124 lb 1.9 oz (56.3 kg) Height:  '5\' 1"'$  (154.9 cm)  BEHAVIORAL SYMPTOMS/MOOD NEUROLOGICAL BOWEL NUTRITION STATUS      Continent Diet (see d/c summary)  AMBULATORY STATUS COMMUNICATION OF NEEDS Skin   Extensive Assist Verbally Normal                       Personal Care Assistance Level of Assistance  Bathing, Feeding, Dressing Bathing Assistance: Maximum assistance Feeding assistance: Independent Dressing Assistance: Limited assistance     Functional Limitations Info  Sight, Speech, Hearing Sight Info: Impaired Hearing Info: Adequate Speech Info: Adequate    SPECIAL CARE FACTORS FREQUENCY  OT (By licensed OT), PT (By licensed PT)     PT  Frequency: 5x/week OT Frequency: 5x/week            Contractures Contractures Info: Not present    Additional Factors Info  Code Status, Allergies, Isolation Precautions Code Status Info: full code Allergies Info: Levaquin (levofloxacin), Allegra (fexofenadine)     Isolation Precautions Info: droplet precautions     Current Medications (08/01/2022):  This is the current hospital active medication list Current Facility-Administered Medications  Medication Dose Route Frequency Provider Last Rate Last Admin   0.9 %  sodium chloride infusion  250 mL Intravenous PRN Joette Catching, PA-C       acetaminophen (TYLENOL) tablet 650 mg  650 mg Oral Q4H PRN Joette Catching, PA-C       albuterol (PROVENTIL) (2.5 MG/3ML) 0.083% nebulizer solution 2.5 mg  2.5 mg Nebulization Q6H PRN Orma Flaming, MD       aspirin chewable tablet 81 mg  81 mg Oral Daily Joette Catching, [place]   81 mg at 08/01/22 0902   heparin injection 5,000 Units  5,000 Units Subcutaneous Q8H Joette Catching, [place]   5,000 Units at 08/01/22 0600   midodrine (PROAMATINE) tablet 2.5 mg  2.5 mg Oral TID WC Sabharwal, Aditya, DO   2.5 mg at 08/01/22 1219   ondansetron (ZOFRAN) injection 4 mg  4 mg Intravenous Q6H PRN Joette Catching, PA-C   4 mg at 07/31/22 1928   sertraline (ZOLOFT) tablet 50 mg  50 mg Oral Daily Joette Catching, PA-C   50 mg at  08/01/22 0902   sodium chloride 0.9 % bolus 250 mL  250 mL Intravenous Once Bensimhon, Shaune Pascal, MD   Stopped at 07/31/22 1620   sodium chloride flush (NS) 0.9 % injection 3 mL  3 mL Intravenous Q12H Joette Catching, PA-C   3 mL at 08/01/22 6226   sodium chloride flush (NS) 0.9 % injection 3 mL  3 mL Intravenous PRN Joette Catching, PA-C         Discharge Medications: Please see discharge summary for a list of discharge medications.  Relevant Imaging Results:  Relevant Lab Results:   Additional Information SSN [place]  [place], [place]

## 2022-08-01 NOTE — Evaluation (Signed)
Physical Therapy Evaluation Patient Details Name: Bethany Nichols MRN: [id number] DOB: 1933-03-18 Today's Date: 08/01/2022  History of Present Illness  88 y.o. female presents to [facility] hospital on 07/31/2022 with SOB and cough. Pt admitted for management of new onset acute systolic heart failure. PMH includes anxiety, CHB, CVA, depression, HLD, HTN.  Clinical Impression  Pt presents to PT with deficits in functional mobility, strength, power, gait, balance, endurance. Pt demonstrates difficulty ascending from sitting to standing, with posterior lean and requiring UE support for stability. Pt ambulates a short distance with increased lateral sway. Pt's current strength deficits lead to an increased risk for falls which the pt is well aware of. Pt will benefit from aggressive mobilization in an effort to improve strength and stability. PT recommends SNF placement due to limited caregiver support and high falls risk.       Recommendations for follow up therapy are one component of a multi-disciplinary discharge planning process, led by the attending physician.  Recommendations may be updated based on patient status, additional functional criteria and insurance authorization.  Follow Up Recommendations Skilled nursing-short term rehab (<3 hours/day) Can patient physically be transported by private vehicle: Yes    Assistance Recommended at Discharge Intermittent Supervision/Assistance  Patient can return home with the following  A little help with walking and/or transfers;A little help with bathing/dressing/bathroom;Assistance with cooking/housework;Assist for transportation;Help with stairs or ramp for entrance    Equipment Recommendations BSC/3in1  Recommendations for Other Services       Functional Status Assessment Patient has had a recent decline in their functional status and demonstrates the ability to make significant improvements in function in a reasonable and predictable amount of  time.     Precautions / Restrictions Precautions Precautions: Fall Restrictions Weight Bearing Restrictions: No      Mobility  Bed Mobility Overal bed mobility: Needs Assistance Bed Mobility: Supine to Sit     Supine to sit: Min guard, HOB elevated          Transfers Overall transfer level: Needs assistance Equipment used: Rolling walker (2 wheels) Transfers: Sit to/from Stand Sit to Stand: Min guard           General transfer comment: posterior lean against bed, requiring multiple attempts to ascend into standing    Ambulation/Gait Ambulation/Gait assistance: Min assist Gait Distance (Feet): 15 Feet Assistive device: Rolling walker (2 wheels) Gait Pattern/deviations: Step-to pattern, Decreased stance time - left, Decreased step length - left Gait velocity: reduced Gait velocity interpretation: <1.31 ft/sec, indicative of household ambulator   General Gait Details: pt with slowed step-through gait, increased weight shift to R side when stepping with left  Stairs            Wheelchair Mobility    Modified Rankin (Stroke Patients Only)       Balance Overall balance assessment: Needs assistance Sitting-balance support: No upper extremity supported, Feet supported Sitting balance-Leahy Scale: Fair     Standing balance support: Single extremity supported, Bilateral upper extremity supported, Reliant on assistive device for balance Standing balance-Leahy Scale: Poor                               Pertinent Vitals/Pain Pain Assessment Pain Assessment: No/denies pain    Home Living Family/patient expects to be discharged to:: Private residence Living Arrangements: Alone Available Help at Discharge: Family;Available PRN/intermittently (son, DIL) Type of Home: House Home Access: Stairs to enter Entrance Stairs-Rails: None Entrance  Stairs-Number of Steps: 2   Home Layout: One level Home Equipment: Conservation officer, nature (2 wheels);Rollator  (4 wheels)      Prior Function Prior Level of Function : Needs assist             Mobility Comments: pt ambulates with use of RW inside and rollator outdoors, family assist with transportation ADLs Comments: pt is able to perform household ADLs including bathing dressing cooking cleaning. Family assists with grocery pickup     Hand Dominance        Extremity/Trunk Assessment   Upper Extremity Assessment Upper Extremity Assessment: Generalized weakness    Lower Extremity Assessment Lower Extremity Assessment: Generalized weakness    Cervical / Trunk Assessment Cervical / Trunk Assessment: Kyphotic  Communication   Communication: No difficulties  Cognition Arousal/Alertness: Awake/alert Behavior During Therapy: WFL for tasks assessed/performed Overall Cognitive Status: Within Functional Limits for tasks assessed                                          General Comments General comments (skin integrity, edema, etc.): pt sats stable on room air, HR alarming with Vtach intermittently during session and vent rhythm other times. Pt appears asyptomatic    Exercises     Assessment/Plan    PT Assessment Patient needs continued PT services  PT Problem List Decreased strength;Decreased activity tolerance;Decreased balance;Decreased mobility;Decreased knowledge of use of DME;Cardiopulmonary status limiting activity       PT Treatment Interventions DME instruction;Gait training;Stair training;Therapeutic activities;Functional mobility training;Therapeutic exercise;Balance training;Neuromuscular re-education;Patient/family education;Cognitive remediation    PT Goals (Current goals can be found in the Care Plan section)  Acute Rehab PT Goals Patient Stated Goal: to get stronger and not fall PT Goal Formulation: With patient Time For Goal Achievement: 08/15/22 Potential to Achieve Goals: Good    Frequency Min 3X/week (possible progression to D/C home)      Co-evaluation               AM-PAC PT "6 Clicks" Mobility  Outcome Measure Help needed turning from your back to your side while in a flat bed without using bedrails?: A Little Help needed moving from lying on your back to sitting on the side of a flat bed without using bedrails?: A Little Help needed moving to and from a bed to a chair (including a wheelchair)?: A Little Help needed standing up from a chair using your arms (e.g., wheelchair or bedside chair)?: A Little Help needed to walk in hospital room?: Total Help needed climbing 3-5 steps with a railing? : Total 6 Click Score: 14    End of Session   Activity Tolerance: Patient tolerated treatment well Patient left: in chair;with call bell/phone within reach;with chair alarm set Nurse Communication: Mobility status PT Visit Diagnosis: Unsteadiness on feet (R26.81);Muscle weakness (generalized) (M62.81)    Time: 0927-1000 PT Time Calculation (min) (ACUTE ONLY): 33 min   Charges:   PT Evaluation $PT Eval Low Complexity: [place], PT, DPT Acute Rehabilitation Office [phone]   Zenaida Niece 08/01/2022, 10:13 AM

## 2022-08-01 NOTE — Evaluation (Signed)
Occupational Therapy Evaluation Patient Details Name: Bethany Nichols MRN: [id number] DOB: May 03, 1933 Today's Date: 08/01/2022   History of Present Illness 88 y.o. female presents to [facility] hospital on 07/31/2022 with SOB and cough. Pt admitted for management of new onset acute systolic heart failure. PMH includes anxiety, CHB, CVA, depression, HLD, HTN.   Clinical Impression   Dehlia was evaluated s/p the above admission list, per report she is I/mod I at baseline with use of DME. Upon evaluation she now presents with functional limitations due to generalized weakness and decreased activity tolerance. Pt will also benefit from further cognitive assessment, cognition was [facility] for simple tasks assessed however noted slowed processing and some confusion. Overall she required min A for transfers and short ambulation with RW. Due to deficits listed below she requires up to mod A for ADLs. Recommend d/c to SNF for continued therapies.      Recommendations for follow up therapy are one component of a multi-disciplinary discharge planning process, led by the attending physician.  Recommendations may be updated based on patient status, additional functional criteria and insurance authorization.   Follow Up Recommendations  Skilled nursing-short term rehab (<3 hours/day)    Assistance Recommended at Discharge Frequent or constant Supervision/Assistance  Patient can return home with the following A little help with walking and/or transfers;A little help with bathing/dressing/bathroom;Assistance with cooking/housework;Direct supervision/assist for financial management;Direct supervision/assist for medications management;Assist for transportation;Help with stairs or ramp for entrance    Functional Status Assessment  Patient has had a recent decline in their functional status and demonstrates the ability to make significant improvements in function in a reasonable and predictable amount of time.  Equipment  Recommendations  Other (comment) (defer)    Recommendations for Other Services       Precautions / Restrictions Precautions Precautions: Fall Restrictions Weight Bearing Restrictions: No      Mobility Bed Mobility Overal bed mobility: Needs Assistance             General bed mobility comments: OOB in chair upon arrival    Transfers Overall transfer level: Needs assistance Equipment used: Rolling walker (2 wheels) Transfers: Sit to/from Stand Sit to Stand: Min assist                  Balance Overall balance assessment: Needs assistance Sitting-balance support: No upper extremity supported, Feet supported Sitting balance-Leahy Scale: Fair     Standing balance support: Single extremity supported, Bilateral upper extremity supported, Reliant on assistive device for balance Standing balance-Leahy Scale: Poor                             ADL either performed or assessed with clinical judgement   ADL Overall ADL's : Needs assistance/impaired Eating/Feeding: Independent;Sitting   Grooming: Min guard;Standing   Upper Body Bathing: Minimal assistance;Sitting   Lower Body Bathing: Moderate assistance;Sit to/from stand   Upper Body Dressing : Minimal assistance;Sitting   Lower Body Dressing: Moderate assistance;Sit to/from stand   Toilet Transfer: Minimal assistance;Rolling walker (2 wheels);Ambulation   Toileting- Clothing Manipulation and Hygiene: Min guard;Sit to/from stand       Functional mobility during ADLs: Minimal assistance;Rolling walker (2 wheels) General ADL Comments: L weakness, decreased activity tolerance     Vision Baseline Vision/History: 0 No visual deficits Vision Assessment?: No apparent visual deficits     Perception     Praxis      Pertinent Vitals/Pain Pain Assessment Pain Assessment: No/denies pain  Hand Dominance Right   Extremity/Trunk Assessment Upper Extremity Assessment Upper Extremity  Assessment: LUE deficits/detail LUE Deficits / Details: residual MMT, ROM and coordination deficits from prior CVA LUE Sensation: decreased light touch LUE Coordination: decreased fine motor;decreased gross motor   Lower Extremity Assessment Lower Extremity Assessment: Generalized weakness   Cervical / Trunk Assessment Cervical / Trunk Assessment: Kyphotic   Communication Communication Communication: No difficulties   Cognition Arousal/Alertness: Awake/alert Behavior During Therapy: WFL for tasks assessed/performed Overall Cognitive Status: Within Functional Limits for tasks assessed                                 General Comments: slow processing. will benefit from mini cog for short blessed assessment     General Comments  VSS on RA, friend visiting    Exercises     Shoulder Instructions      Home Living Family/patient expects to be discharged to:: Private residence Living Arrangements: Alone Available Help at Discharge: Family;Available PRN/intermittently Type of Home: House Home Access: Stairs to enter [employer] of Steps: 2 Entrance Stairs-Rails: None Home Layout: One level     Bathroom Shower/Tub: Tub only         Home Equipment: Conservation officer, nature (2 wheels);Rollator (4 wheels);Shower seat          Prior Functioning/Environment Prior Level of Function : Needs assist             Mobility Comments: pt ambulates with use of RW inside and rollator outdoors, family assist with transportation ADLs Comments: pt is able to perform household ADLs including bathing dressing cooking cleaning. Family assists with grocery pickup        OT Problem List: Decreased strength;Decreased range of motion;Decreased activity tolerance;Impaired balance (sitting and/or standing);Decreased safety awareness      OT Treatment/Interventions: Therapeutic exercise;Self-care/ADL training;DME and/or AE instruction;Therapeutic activities;Balance  training;Patient/family education    OT Goals(Current goals can be found in the care plan section) Acute Rehab OT Goals Patient Stated Goal: go to SNF in [place] OT Goal Formulation: With patient Time For Goal Achievement: 08/15/22 Potential to Achieve Goals: Good ADL Goals Pt Will Perform Grooming: with modified independence;standing Pt Will Perform Upper Body Dressing: sitting;with set-up Pt Will Perform Lower Body Dressing: sit to/from stand;with supervision Pt Will Transfer to Toilet: ambulating;regular height toilet;with modified independence Pt/caregiver will Perform Home Exercise Program: Increased strength;Both right and left upper extremity;With written HEP provided;With Supervision  OT Frequency: Min 2X/week    Co-evaluation              AM-PAC OT "6 Clicks" Daily Activity     Outcome Measure Help from another person eating meals?: None Help from another person taking care of personal grooming?: A Little Help from another person toileting, which includes using toliet, bedpan, or urinal?: A Lot Help from another person bathing (including washing, rinsing, drying)?: A Lot Help from another person to put on and taking off regular upper body clothing?: A Little Help from another person to put on and taking off regular lower body clothing?: A Lot 6 Click Score: 16   End of Session Equipment Utilized During Treatment: Gait belt;Rolling walker (2 wheels) Nurse Communication: Mobility status  Activity Tolerance: Patient tolerated treatment well Patient left: in chair;with call bell/phone within reach;with chair alarm set;with family/visitor present  OT Visit Diagnosis: Other abnormalities of gait and mobility (R26.89);Unsteadiness on feet (R26.81);History of falling (Z91.81);Muscle weakness (generalized) (M62.81)  Time: 8413-2440 OT Time Calculation (min): 42 min Charges:  OT General Charges $OT Visit: 1 Visit OT Evaluation $OT Eval Moderate Complexity: 1  Mod OT Treatments $Therapeutic Activity: 23-37 mins    Elliot Cousin 08/01/2022, 4:21 PM

## 2022-08-01 NOTE — Assessment & Plan Note (Signed)
Acute metabolic encephalopathy Clinically has improved, no signs of delirium. Encephalopathy has resolved.  Continue neuro checks per unit protocol.   Continue with sertraline for depression

## 2022-08-01 NOTE — Hospital Course (Signed)
Consultation for encephalopathy.   88 yo female with the past medical history of heart failure, CVA, hypertension, dyslipidemia and depression who presented to [facility] with worsening dyspnea for 2 to 3 days, associated with productive cough. She received outpatient therapy with oral steroids with no significant improvement in her symptoms. Because worsening dyspnea she presented in the ED.  She was hemodynamically stable and was admitted for further workup.  Echocardiogram showed LV systolic function 30 to 51%, and she was transferred to [facility] for further work up.  10/03 cardiac catheterization with normal coronaries.  She has showed intermittent confusion.  10/04 encephalopathy has resolved, pending transfer to SNF

## 2022-08-01 NOTE — TOC Initial Note (Addendum)
Transition of Care [facility]) - Initial/Assessment Note    Patient Details  Name: Bethany Nichols MRN: [id number] Date of Birth: December 11, 1932  [facility] CM/SW Contact:    Bethany Nichols, [place] Phone Number: 08/01/2022, 3:22 PM  Clinical Narrative:                  CSW met with pt to discuss SNF recommendation. Pt state she lives at home alone in [place]. Her son lives next door though she reports he has stage 4 cancer and is unable to provide much physical assistance. Pt is agreeable for SNF workup and is interested in a facility in [place] area. She consents to [place] calling and updated her son Bethany Nichols. CSW explained workup and snf auth processes. Fl2 completed and bed requests faxed in hub. Pt expresses she would need transport to SNF at [facility] called pts son Bethany Nichols and provided disposition update. He is agreeable to SNF workup.   CSW called [facility] Bethany Nichols [facility]) [phone]; left message with pt's social worker requesting return call.   CSW received call back shortly after from Bethany Nichols, [phone]. CSW explained PT/oT recommendation for SNF and that pt is agreeable. Nichols explains that [facility] will provide therapy at their day center m-f instead of pts going to SNF. They would transport pt for day program. CSW explained that pt reports that she does not have supervision or assistance available through friends or family at home so she would be home alone at nights and weekends. Nichols requested that [facility] fax PT/OT notes for [facility] therapists to review to help determine appropriate level of care for pt. CSW faxed clinicals to [phone].   Expected Discharge Plan: [place] Barriers to Discharge: Continued Medical Work up   Patient Goals and CMS Choice        Expected Discharge Plan and Services Expected Discharge Plan: [place] arrangements for the past 2 months: Single Family Home                                       Prior Living Arrangements/Services Living arrangements for the past 2 months: Single Family Home Lives with:: Self, Other (Comment) (Son lives next door. Pt reports he has stage 4 cancer and unable to assist very much)                   Activities of Daily Living      Permission Sought/Granted   Permission granted to share information with : Yes, Verbal Permission Granted  Share Information with NAME: Adessa, Primiano (Son)   [phone] [facility] Phone)           Emotional Assessment Appearance:: Appears stated age Attitude/Demeanor/Rapport: Engaged Affect (typically observed): Accepting Orientation: : Oriented to Self, Oriented to Place, Oriented to  Time, Oriented to Situation Alcohol / Substance Use: Not Applicable Psych Involvement: No (comment)  Admission diagnosis:  Acute systolic CHF (congestive heart failure) (HCC) [I50.21] Patient Active Problem List   Diagnosis Date Noted   Acute systolic CHF (congestive heart failure) ([place]) 07/31/2022   Depression and anxiety 07/31/2022   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

## 2022-08-01 NOTE — Progress Notes (Addendum)
Advanced Heart Failure Rounding Note  PCP-Cardiologist: Dr.  Nones   Subjective:    Feels "tired" this morning.   Denies current dyspnea but remains in 3L [place], O2 sats 96%.   BPs soft overnight, upper 10X-NAT 557D systolic. On midodrine 2.5 mg tid.  SCr ok post cath, 0.88.   Says appetite is "fair".     R/LHC 10/3 HEMODYNAMICS: RA:                  2 mmHg (mean) RV:                  23/2 mmHg PA:                  19/6 mmHg (14 mean) PCWP:            5 mmHg (mean)                                      Estimated Fick CO/CI   3.6 L/min, 2.3 L/min/m2 AO: 106/69 LVEDP: 5                                              TPG                 9  mmHg                                              PVR                 2.5 Wood Units  PAPi                6.5       IMPRESSION: 1.           Low pre and post capillary filling pressures requiring IVF bolus due to hypotension. Cardiac index mildly reduced likely secondary to both new onset systolic heart failure and low filling pressures. 2.         No significant coronary artery disease. 3.         Tortuous aorta requiring JL5 and Amplatz right 1 for coronary angiography.   Objective:   Weight Range: 56.3 kg Body mass index is 23.45 kg/m.   Vital Signs:   Temp:  [97.5 F (36.4 C)-97.6 F (36.4 C)] 97.5 F (36.4 C) (10/04 0504) Pulse Rate:  [57-72] 59 (10/04 0600) Resp:  [11-22] 17 (10/04 0600) BP: (72-134)/(49-118) 109/62 (10/04 0600) SpO2:  [87 %-96 %] 94 % (10/04 0600) Weight:  [56.3 kg-60.5 kg] 56.3 kg (10/04 0215)    Weight change: Filed Weights   07/31/22 1716 08/01/22 0215  Weight: 60.5 kg 56.3 kg    Intake/Output:   Intake/Output Summary (Last 24 hours) at 08/01/2022 0714 Last data filed at 08/01/2022 0600 Gross per 24 hour  Intake 454.17 ml  Output 900 ml  Net -445.83 ml      Physical Exam    General:  Well appearing elderly WF. No resp difficulty HEENT: Normal Neck: Supple. JVP not elevated.  Carotids 2+ bilat; no bruits. No lymphadenopathy or thyromegaly appreciated. Cor: PMI nondisplaced. Regular rate & rhythm. No rubs, gallops or  murmurs. Lungs: Clear Abdomen: Soft, nontender, nondistended. No hepatosplenomegaly. No bruits or masses. Good bowel sounds. Extremities: No cyanosis, clubbing, rash, edema Neuro: Alert & orientedx3, cranial nerves grossly intact. moves all 4 extremities w/o difficulty. Affect pleasant   Telemetry   NSR 60s   EKG    N/A   Labs    CBC Recent Labs    07/31/22 1419 07/31/22 1752  WBC  --  9.8  NEUTROABS  --  6.7  HGB 14.3 13.6  HCT 42.0 41.5  MCV  --  90.6  PLT  --  782   Basic Metabolic Panel Recent Labs    07/31/22 1752 08/01/22 0109  NA 135 137  K 3.7 3.6  CL 100 98  CO2 28 30  GLUCOSE 110* 107*  BUN 23 21  CREATININE 0.82 0.88  CALCIUM 8.5* 8.8*   Liver Function Tests Recent Labs    07/31/22 1752  AST 24  ALT 15  ALKPHOS 54  BILITOT 0.7  PROT 6.3*  ALBUMIN 3.3*   No results for input(s): "LIPASE", "AMYLASE" in the last 72 hours. Cardiac Enzymes No results for input(s): "CKTOTAL", "CKMB", "CKMBINDEX", "TROPONINI" in the last 72 hours.  BNP: BNP (last 3 results) Recent Labs    07/31/22 1752  BNP 702.2*    ProBNP (last 3 results) No results for input(s): "PROBNP" in the last 8760 hours.   D-Dimer No results for input(s): "DDIMER" in the last 72 hours. Hemoglobin A1C No results for input(s): "HGBA1C" in the last 72 hours. Fasting Lipid Panel No results for input(s): "CHOL", "HDL", "LDLCALC", "TRIG", "CHOLHDL", "LDLDIRECT" in the last 72 hours. Thyroid Function Tests Recent Labs    07/31/22 1752  TSH 4.185    Other results:   Imaging    CARDIAC CATHETERIZATION  Result Date: 07/31/2022 HEMODYNAMICS: RA:   2 mmHg (mean) RV:   23/2 mmHg PA:   19/6 mmHg (14 mean) PCWP:  5 mmHg (mean)    Estimated Fick CO/CI   3.6 L/min, 2.3 L/min/m2 AO: 106/69 LVEDP: 5    TPG    9  mmHg     PVR     2.5 Wood  Units PAPi      6.5  IMPRESSION: 1.   Low pre and post capillary filling pressures requiring IVF bolus due to hypotension. Cardiac index mildly reduced likely secondary to both new onset systolic heart failure and low filling pressures. 2. No significant coronary artery disease. 3.  Tortuous aorta requiring JL5 and Amplatz right 1 for coronary angiography. Bethany Bickers, MD 3:54 PM     Medications:     Scheduled Medications:  aspirin  81 mg Oral Daily   heparin  5,000 Units Subcutaneous Q8H   midodrine  2.5 mg Oral TID WC   sertraline  50 mg Oral Daily   sodium chloride flush  3 mL Intravenous Q12H    Infusions:  sodium chloride     sodium chloride Stopped (07/31/22 1620)    PRN Medications: sodium chloride, acetaminophen, albuterol, ondansetron (ZOFRAN) IV, sodium chloride flush    Patient Profile   Bethany Nichols is a 88y.o. female with PMH of stroke w/ L sided weakness, anxiety, HTN, HLP, and LBBB>CHB s/p saint jude dual-chamber ICD 6/20'. Transferred to [facility] from [facility] hospital after new findings of acute systolic heart failure, EF 30%. AHF team asked to see for further evaluation  Assessment/Plan   Acute systolic heart failure ([place])  - Echo 6/20: EF 60-65%, RV norm sys function, trivial MR  and TR, RVSP mildly elevated at 38.7.  - Echo at [facility] 10/2: EF 30-35%, LV with impaired relaxation pattern, mild concentric hypertrophy of LV. Mild MR and TR, PASP 24 - NICM. LHC w/ normal coronaries. RHC w/ low filling pressures and mildly reduced CO, CI 2.3  L/min/m2 - Suspected 2/2 LBBB. Also w/ CHB, however only 1% burden RV pacing on device interrogation. EP to see to consdier CRT-P - hold diuretics again today in setting of borderline hypotension, low filling pressures on RHC and marginal CI  -GDMT currently limited by low BP requiring midodrine     Intermittent complete heart block, LBBB - Status post [employer] dual-chamber pacemaker implanted 6/22  - Device  interrogation revealed: AP 18%, VP 1.2%, no AT/AF, 43 PMT episodes since May - followed by Dr. Curt Bears. ? Upgrade to CRT-P   HTN - hypotensive in cath lab requiring IVF boluses  - BP remains soft, upper 80s-100s - continue midodrine 2.5 mg bid, up titrate as needed     Length of Stay: 1  Bethany Simmons, PA-C  08/01/2022, 7:14 AM  Advanced Heart Failure Team Pager [phone] (M-F; 7a - 5p)  Please contact [facility] for night-coverage after hours (5p -7a ) and weekends on [email or web address]  Patient seen and examined with the above-signed Advanced Practice Provider and/or Housestaff. I personally reviewed laboratory data, imaging studies and relevant notes. I independently examined the patient and formulated the important aspects of the plan. I have edited the note to reflect any of my changes or salient points. I have personally discussed the plan with the patient and/or family.  Cath yesterday with no CAD. Filling pressures very low. Denies CP or SOB.  Pleasantly confused at times.  BP soft on midodrine.   Pacer interrogation VP 1.5%  ECG sinus LBBB (162m)  General:  Elderly frail. No resp difficulty HEENT: normal Neck: supple. no JVD. Carotids 2+ bilat; no bruits. No lymphadenopathy or thryomegaly appreciated. Cor: PMI nondisplaced. Regular rate & rhythm. No rubs, gallops or murmurs. Lungs: clear Abdomen: soft, nontender, nondistended. No hepatosplenomegaly. No bruits or masses. Good bowel sounds. Extremities: no cyanosis, clubbing, rash, edema Neuro: intermittent mild confusion cranial nerves grossly intact. moves all 4 extremities w/o difficulty. Affect pleasant  Etiology of LV dysfunction unclear. No CAD. RV pacing burden is low. LBBB only 132 ms.   Not candidate for cMRI with PPM.  Currently likely not candidate for CRT with dementia, QRS only 1359mand need for 3 months of GDMT.   Unfortunately GDMT currently limited by low BP. Will need to try and titrate slowly.    She is currently living alone (son lives close by). Is she safe to return home?   DaGlori BickersMD  10:01 AM

## 2022-08-01 NOTE — Progress Notes (Signed)
  Progress Note   Patient: Bethany Nichols ZCH:[id number] DOB: 18-Mar-1933 DOA: 07/31/2022     1 DOS: the patient was seen and examined on 08/01/2022   Brief hospital course: Consultation for encephalopathy.   88 yo female with the past medical history of heart failure, CVA, hypertension, dyslipidemia and depression who presented to [facility] with worsening dyspnea for 2 to 3 days, associated with productive cough. She received outpatient therapy with oral steroids with no significant improvement in her symptoms. Because worsening dyspnea she presented in the ED.  She was hemodynamically stable and was admitted for further workup.  Echocardiogram showed LV systolic function 30 to 28%, and she was transferred to [facility] for further work up.  10/03 cardiac catheterization with normal coronaries.  She has showed intermittent confusion.    Assessment and Plan: * Acute systolic CHF (congestive heart failure) (HCC) Echocardiogram with reduced LV systolic function 30 to 78%. 10/04 Cardiac catheterization RA 2  RV 23/2 PA 19/6 with mean 14 PCWP 5  Cardiac output 3,6 and index. 2,3 Fick.  PVR 2,5  No significant coronary artery disease.   Patient required IV fluids bolus due to hypotension  Continue medical therapy with  Midodrine Hold on RAS inh due to risk of hypotension.   Intermittent confusion Acute metabolic encephalopathy Clinically has improved, no signs of delirium.  Continue neuro checks per unit protocol.   Continue with sertraline for depression   Acute respiratory failure with hypoxia [facility]) Patient with improvement in volume status and improvement in oxygenation. Continue Pt and Ot.   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         Subjective: Patient is  feeling well, no chest pain or dyspnea   Physical Exam: Vitals:   08/01/22 0504 08/01/22 0600 08/01/22 0721 08/01/22 1102  BP: (!) 89/59 109/62 115/64 (!) 84/67  Pulse: (!) 59 (!) 59 60 73  Resp: '20 17 13 18  '$ Temp: (!) 97.5 F (36.4 C)  97.9 F (36.6 C) 98.6 F (37 C)  TempSrc: Oral  Oral Oral  SpO2: 94% 94% 93% 90%  Weight:      Height:       Neurology awake and alert ENT with no pallor Cardiovascular with S1 and S2 present and rhythmic Respiratory with no raled or wheezing Abdomen with no distention  No lower extremity edema  Data Reviewed:    Family Communication: no family at the bedside   Disposition: Status is: Inpatient Remains inpatient appropriate because: heart failure   Planned Discharge Destination: Skilled nursing facility      Author: Tawni Millers, MD 08/01/2022 1:58 PM  For on call review [email or web address].

## 2022-08-01 NOTE — Progress Notes (Signed)
Oxygen desaturation noted on the monitor. Assessed patient; patient asleep and appeared to be demonstrating sleep apnea with desaturation into the 70s. Per respiratory therapy, apply 3L of oxygen until sleep apnea is formally investigated.

## 2022-08-01 NOTE — Assessment & Plan Note (Signed)
Patient with improvement in volume status and improvement in oxygenation. Continue Pt and Ot.

## 2022-08-02 DIAGNOSIS — I5021 Acute systolic (congestive) heart failure: Secondary | ICD-10-CM | POA: Diagnosis not present

## 2022-08-02 DIAGNOSIS — I6930 Unspecified sequelae of cerebral infarction: Secondary | ICD-10-CM | POA: Diagnosis not present

## 2022-08-02 DIAGNOSIS — R410 Disorientation, unspecified: Secondary | ICD-10-CM | POA: Diagnosis not present

## 2022-08-02 DIAGNOSIS — J9601 Acute respiratory failure with hypoxia: Secondary | ICD-10-CM | POA: Diagnosis not present

## 2022-08-02 DIAGNOSIS — N179 Acute kidney failure, unspecified: Secondary | ICD-10-CM

## 2022-08-02 LAB — BASIC METABOLIC PANEL
Anion gap: 6 (ref 5–15)
BUN: 29 mg/dL — ABNORMAL HIGH (ref 8–23)
CO2: 25 mmol/L (ref 22–32)
Calcium: 8.7 mg/dL — ABNORMAL LOW (ref 8.9–10.3)
Chloride: 103 mmol/L (ref 98–111)
Creatinine, Ser: 1.24 mg/dL — ABNORMAL HIGH (ref 0.44–1.00)
GFR, Estimated: 42 mL/min — ABNORMAL LOW (ref >60)
Glucose, Bld: 99 mg/dL (ref 70–99)
Potassium: 4.5 mmol/L (ref 3.5–5.1)
Sodium: 134 mmol/L — ABNORMAL LOW (ref 135–145)

## 2022-08-02 MED ORDER — GUAIFENESIN-DM 100-10 MG/5ML PO SYRP
15.0000 mL | ORAL_SOLUTION | ORAL | Status: DC | PRN
  Administered 2022-08-02: 15 mL via ORAL
  Filled 2022-08-02: qty 15

## 2022-08-02 NOTE — Progress Notes (Signed)
  Progress Note   Patient: Bethany Nichols ZOX:[id number] DOB: Feb 14, 1933 DOA: 07/31/2022     2 DOS: the patient was seen and examined on 08/02/2022   Brief hospital course: Consultation for encephalopathy.   88 yo female with the past medical history of heart failure, CVA, hypertension, dyslipidemia and depression who presented to [facility] with worsening dyspnea for 2 to 3 days, associated with productive cough. She received outpatient therapy with oral steroids with no significant improvement in her symptoms. Because worsening dyspnea she presented in the ED.  She was hemodynamically stable and was admitted for further workup.  Echocardiogram showed LV systolic function 30 to 81%, and she was transferred to [facility] for further work up.  10/03 cardiac catheterization with normal coronaries.  She has showed intermittent confusion.  10/04 encephalopathy has resolved, pending transfer to SNF   Assessment and Plan: * Acute systolic CHF (congestive heart failure) (HCC) Echocardiogram with reduced LV systolic function 30 to 19%. 10/04 Cardiac catheterization RA 2  RV 23/2 PA 19/6 with mean 14 PCWP 5  Cardiac output 3,6 and index. 2,3 Fick.  PVR 2,5  No significant coronary artery disease.   Patient required IV fluids bolus due to hypotension  Continue medical therapy with  Midodrine Hold on RAS inh due to risk of hypotension.   Intermittent confusion Acute metabolic encephalopathy Clinically has improved, no signs of delirium. Encephalopathy has resolved.  Continue neuro checks per unit protocol.   Continue with sertraline for depression   Acute respiratory failure with hypoxia [facility]) Patient with improvement in volume status and improvement in oxygenation. Continue Pt and Ot.   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   AKI (acute kidney injury) ([place]) Hyponatremia  Renal function with serum cr at 1,24, K is 4,5 and serum bicarbonate at 24. Na is 134.  Continue to hold on loop diuretic therapy Avoid hypotension or nephrotoxic medications Follow up renal function in am.         Subjective: Patient is feeling better, with no dyspnea or chest pain, no edema   Physical Exam: Vitals:   08/02/22 0016 08/02/22 0503 08/02/22 0832 08/02/22 1200  BP: 136/66 116/60 107/65   Pulse: 61 63 84   Resp: '17 17 18 20  '$ Temp: 98.2 F (36.8 C) 98.6 F (37 C) 98.5 F (36.9 C) 99.1 F (37.3 C)  TempSrc: Axillary Oral Oral Oral  SpO2: 97% 99% 96%   Weight:  58.4 kg    Height:       Neurology awake and alert ENT with no pallor Cardiovascular with S1 and S2 present and rhythmic  Respiratory with no rales or wheezing Abdomen with no distention  No lower extremity edema  Data Reviewed:    Family Communication: no family at the bedside.I spoke over the phone with the patient's son about patient's  condition, plan of care, prognosis and all questions were addressed.   Disposition: Status is: Inpatient Remains inpatient appropriate because: heart failure resolved, pending placement at SNF  Planned Discharge Destination: Skilled nursing facility     Author: Tawni Millers, MD 08/02/2022 2:17 PM  For on call review [email or web address].

## 2022-08-02 NOTE — Progress Notes (Addendum)
CARDIAC REHAB PHASE I   Pt received in recliner, agreeable to education. Pt educated CHF booklet, nutrition, and restrictions. Pt is currently n/a for independent home exercise or CRP2 referral. Awaiting further mobility progression before attempt to ambulate in hallway.  [place], MS 08/02/2022 2:25 PM

## 2022-08-02 NOTE — Progress Notes (Signed)
Mobility Specialist Progress Note    08/02/22 1156  Mobility  Activity Ambulated with assistance in room  Activity Response Tolerated well  Distance Ambulated (ft) 60 ft  $Mobility charge 1 Mobility  Level of Assistance Minimal assist, patient does 75% or more  Assistive Device Front wheel walker  Mobility Referral Yes   Pre-Mobility: 64 HR, 93% SpO2  Pt received in bed and agreeable. No complaints. After ambulation, completed x5 STS from chair with a steadying assist. Left in chair with call bell in reach and alarm on.    [facility] Mobility Specialist

## 2022-08-02 NOTE — Assessment & Plan Note (Signed)
Hyponatremia  Renal function with serum cr at 1,24, K is 4,5 and serum bicarbonate at 24. Na is 134.  Continue to hold on loop diuretic therapy Avoid hypotension or nephrotoxic medications Follow up renal function in am.

## 2022-08-02 NOTE — TOC Progression Note (Addendum)
Transition of Care [facility]) - Progression Note    Patient Details  Name: Bethany Nichols MRN: [id number] Date of Birth: 21-Jan-1933  Transition of Care [facility]) CM/SW [place], [place] Phone Number: 08/02/2022, 12:00 PM  Clinical Narrative:     Received call from [place] with [facility]. CSW provided update that pt may need her pacemaker traded out. [place] confirmed that they received therapy notes. CSW reiterated concerns about pt being at home alone at nights and on weekends. [place] continued to offer that pt can go home and participate in [facility] daytime rehab program though did not offer a solution to pt's lack of support at home. CSW explained the therapy and medical team believe pt needs SNF if there is no additional support at home if she were to do the day program. CSW inquired if [facility] would approve pt going to SNF. [place] stated she would speak with her supervisor.   Pt currently does not have any bed offers.   1500: [facility] [place] has offered pt a SNF bed.  [place] contacted CSW and confirmed that pt can go to SNF though [facility] will still transport pt to do therapy at their day program during the day.  [place] would like to confirm with pt this plan but was unable to reach pt on room phone. CSW met bedside with pt and [place] on speaker phone. Pt confirmed she is agreeable to admit to [facility], short term, while continuing to receive PT with [facility] day program. [facility] is able to transport to SNF on DC date. CSW called and notified pt's son and he as agreeable with this plan as well.   1530: CSW notified [place] of anticipated DC tomorrow. She will work on Geneticist, molecular.   Expected Discharge Plan: [place] Barriers to Discharge: Continued Medical Work up  Expected Discharge Plan and Services Expected Discharge Plan: [place] arrangements for the past 2 months: Single Family Home                                        Social Determinants of Health (SDOH) Interventions    Readmission Risk Interventions     No data to display

## 2022-08-02 NOTE — Discharge Summary (Addendum)
Advanced Heart Failure Team  Discharge Summary   Patient ID: Bethany Nichols MRN: [id number], DOB/AGE: [birth year] 88 y.o. Admit date: 07/31/2022 D/C date:     08/03/2022   Primary Discharge Diagnoses:  Acute systolic CHF (new)  NICM  Secondary Discharge Diagnoses:  LBBB CHB s/p dual chamber PPM HTN AKI Rhinovirus   Hospital Course:  88 y.o. female with prior hx of CVA with residual L sided weakness, remote hx lymphoma '01, HTN, LBBB, CHB s/p St. Jude dual chamber PPM 06/20, dementia.  Transferred from [facility] with new onset systolic CHF. Echo with EF 30-35%. R/LHC with normal coronaries, low filling pressures with mildly reduced CO. Mildly hypotensive in cath lab treated w/ IVF bolus.   Etiology of CM not certain. Possibly LBBB but QRS < 150 ms and with dementia not CRT candidate. Only 1% RV pacing on device interrogation. Respiratory panel + for rhinovirus. ? Viral cardiomyopathy though would expect troponin elevation.   Unable to start GDMT d/t hypotension requiring initiation of low dose midodrine. K also upper limit of normal at 5.0, limiting addition of spirolactone. Will reassess at outpatient f/u and will try adding GDMT if able to tolerate.  Seen by PT/OT and SNF recommended for rehab at discharge. Placement secured at [facility]. PRN lasix ordered at d/c. Should take 20 mg PO Lasix if > 3 lb wt gain in 24 hr or > 5 lb wt gain in 1 week.   On 10/6, she was last seen and examined by Dr. Daniel Nones and felt stable for d/c to SNF.   Post hospital f/u arranged in the [facility]. Appt info below.     Discharge Weight Range: 133 lb  Discharge Vitals: Blood pressure 109/67, pulse 65, temperature 98.7 F (37.1 C), temperature source Oral, resp. rate 16, height '5\' 1"'$  (1.549 m), weight 60.6 kg, SpO2 92 %.  Labs: Lab Results  Component Value Date   WBC 9.8 07/31/2022   HGB 13.6 07/31/2022   HCT 41.5 07/31/2022   MCV 90.6 07/31/2022   PLT 283 07/31/2022    Recent  Labs  Lab 07/31/22 1752 08/01/22 0109 08/03/22 0051  NA 135   < > 134*  K 3.7   < > 5.0  CL 100   < > 99  CO2 28   < > 27  BUN 23   < > 26*  CREATININE 0.82   < > 0.87  CALCIUM 8.5*   < > 9.7  PROT 6.3*  --   --   BILITOT 0.7  --   --   ALKPHOS 54  --   --   ALT 15  --   --   AST 24  --   --   GLUCOSE 110*   < > 100*   < > = values in this interval not displayed.   No results found for: "CHOL", "HDL", "LDLCALC", "TRIG" BNP (last 3 results) Recent Labs    07/31/22 1752  BNP 702.2*    ProBNP (last 3 results) No results for input(s): "PROBNP" in the last 8760 hours.   Diagnostic Studies/Procedures   R/LHC 07/31/22: HEMODYNAMICS: RA:                  2 mmHg (mean) RV:                  23/2 mmHg PA:                  19/6 mmHg (14 mean) PCWP:  5 mmHg (mean)                                      Estimated Fick CO/CI   3.6 L/min, 2.3 L/min/m2 AO: 106/69 LVEDP: 5                                              TPG                 9  mmHg                                              PVR                 2.5 Wood Units  PAPi                6.5       IMPRESSION: 1.           Low pre and post capillary filling pressures requiring IVF bolus due to hypotension. Cardiac index mildly reduced likely secondary to both new onset systolic heart failure and low filling pressures. 2.         No significant coronary artery disease. 3.         Tortuous aorta requiring JL5 and Amplatz right 1 for coronary angiography.     Discharge Medications   Allergies as of 08/03/2022       Reactions   Levaquin [levofloxacin] Rash   Allegra [fexofenadine] Other (See Comments)   Unknown reaction        Medication List     STOP taking these medications    sodium chloride 1 g tablet       TAKE these medications    acetaminophen 325 MG tablet Commonly known as: TYLENOL Take 650 mg by mouth every 8 (eight) hours as needed for fever (pain).   albuterol 108 (90 Base)  MCG/ACT inhaler Commonly known as: VENTOLIN HFA Inhale 2 puffs into the lungs every 4 (four) hours as needed for wheezing or shortness of breath.   alendronate 70 MG tablet Commonly known as: FOSAMAX Take 70 mg by mouth every Wednesday.   aspirin EC 81 MG tablet Take 81 mg by mouth daily.   CeraVe Daily Moisturizing Lotn Apply 1 application  topically 2 (two) times daily as needed (dry skin).   Chest Rub 4.8-1.2-2.6 % Oint Apply 1 application  topically 2 (two) times daily as needed (rash).   fluticasone 50 MCG/ACT nasal spray Commonly known as: FLONASE Place 1 spray into both nostrils 2 (two) times daily as needed for allergies.   furosemide 20 MG tablet Commonly known as: Lasix Take 1 tablet (20 mg total) by mouth daily as needed. Take for weight gain > 3 lb in 24 hr or > 5 lb in 1 week or for ankle swelling   guaiFENesin 100 MG/5ML liquid Commonly known as: ROBITUSSIN Take 200 mg by mouth every 4 (four) hours as needed for cough.   memantine 5 MG tablet Commonly known as: NAMENDA Take 5 mg by mouth 2 (two) times daily.   midodrine 2.5 MG tablet Commonly known as: PROAMATINE Take 1 tablet (2.5  mg total) by mouth 3 (three) times daily with meals.   Muscle Rub 10-15 % Crea Apply 1 application topically 3 (three) times daily as needed for muscle pain.   Muscle Rub 10-15 % Crea Apply 1 Application topically 3 (three) times daily as needed for muscle pain.   PreserVision AREDS 2 Caps Take 1 capsule by mouth 2 (two) times daily.   sertraline 100 MG tablet Commonly known as: ZOLOFT Take 150 mg by mouth at bedtime.        Disposition   The patient will be discharged in stable condition to home.   Follow-up Information     [facility] Follow up on 08/14/2022.   Specialty: Cardiology Why: [facility] 2 pm Entrance C, Free Valet Parking Please bring medication list to appt Contact information: [address] [id number] [place]        Marco Collie, MD Follow up.   Specialty: Family Medicine Contact information: [address] [place] [address] [phone]         Constance Haw, MD .   Specialty: Cardiology Contact information: [place] [address] [phone]                   Duration of Discharge Encounter: Greater than 35 minutes   Signed, Nelida Gores  08/03/2022, 10:52 AM

## 2022-08-02 NOTE — Progress Notes (Addendum)
Advanced Heart Failure Rounding Note  PCP-Cardiologist: Dr.  Nones   Subjective:    Apnea w/ desats in the 70s overnight. RT called, pt placed on 3L [place].   AKI, Scr 0.88>>1.24. BPs soft overnight, 78E systolic but better this morning in 110s-120s.   Sitting up in bed. Ate all of her breakfast. Breathing improved, currently on RA.   PT/OT recommending SNF     R/LHC 10/3 HEMODYNAMICS: RA:                  2 mmHg (mean) RV:                  23/2 mmHg PA:                  19/6 mmHg (14 mean) PCWP:            5 mmHg (mean)                                      Estimated Fick CO/CI   3.6 L/min, 2.3 L/min/m2 AO: 106/69 LVEDP: 5                                              TPG                 9  mmHg                                              PVR                 2.5 Wood Units  PAPi                6.5       IMPRESSION: 1.           Low pre and post capillary filling pressures requiring IVF bolus due to hypotension. Cardiac index mildly reduced likely secondary to both new onset systolic heart failure and low filling pressures. 2.         No significant coronary artery disease. 3.         Tortuous aorta requiring JL5 and Amplatz right 1 for coronary angiography.   Objective:   Weight Range: 58.4 kg Body mass index is 24.33 kg/m.   Vital Signs:   Temp:  [97.9 F (36.6 C)-98.6 F (37 C)] 98.6 F (37 C) (10/05 0503) Pulse Rate:  [61-73] 63 (10/05 0503) Resp:  [16-18] 17 (10/05 0503) BP: (84-136)/(60-67) 116/60 (10/05 0503) SpO2:  [90 %-99 %] 99 % (10/05 0503) Weight:  [58.4 kg] 58.4 kg (10/05 0503)    Weight change: Filed Weights   07/31/22 1716 08/01/22 0215 08/02/22 0503  Weight: 60.5 kg 56.3 kg 58.4 kg    Intake/Output:   Intake/Output Summary (Last 24 hours) at 08/02/2022 0742 Last data filed at 08/02/2022 0504 Gross per 24 hour  Intake 714 ml  Output 1050 ml  Net -336 ml      Physical Exam    General:  Well appearing elderly WF. Sitting on  side of bed. No resp difficulty HEENT: Normal Neck: Supple. JVP 6 cm Carotids 2+ bilat; no bruits.  No lymphadenopathy or thyromegaly appreciated. Cor: PMI nondisplaced. Regular rate & rhythm. No rubs, gallops or murmurs. Lungs: Clear Abdomen: Soft, nontender, nondistended. No hepatosplenomegaly. No bruits or masses. Good bowel sounds. Extremities: No cyanosis, clubbing, rash, edema Neuro: Alert & orientedx3, cranial nerves grossly intact. moves all 4 extremities w/o difficulty. Affect pleasant   Telemetry   NSR 70s  w/ LBBB   EKG    N/A   Labs    CBC Recent Labs    07/31/22 1419 07/31/22 1752  WBC  --  9.8  NEUTROABS  --  6.7  HGB 14.3 13.6  HCT 42.0 41.5  MCV  --  90.6  PLT  --  408   Basic Metabolic Panel Recent Labs    08/01/22 0109 08/02/22 0032  NA 137 134*  K 3.6 4.5  CL 98 103  CO2 30 25  GLUCOSE 107* 99  BUN 21 29*  CREATININE 0.88 1.24*  CALCIUM 8.8* 8.7*   Liver Function Tests Recent Labs    07/31/22 1752  AST 24  ALT 15  ALKPHOS 54  BILITOT 0.7  PROT 6.3*  ALBUMIN 3.3*   No results for input(s): "LIPASE", "AMYLASE" in the last 72 hours. Cardiac Enzymes No results for input(s): "CKTOTAL", "CKMB", "CKMBINDEX", "TROPONINI" in the last 72 hours.  BNP: BNP (last 3 results) Recent Labs    07/31/22 1752  BNP 702.2*    ProBNP (last 3 results) No results for input(s): "PROBNP" in the last 8760 hours.   D-Dimer No results for input(s): "DDIMER" in the last 72 hours. Hemoglobin A1C No results for input(s): "HGBA1C" in the last 72 hours. Fasting Lipid Panel No results for input(s): "CHOL", "HDL", "LDLCALC", "TRIG", "CHOLHDL", "LDLDIRECT" in the last 72 hours. Thyroid Function Tests Recent Labs    07/31/22 1752  TSH 4.185    Other results:   Imaging    No results found.   Medications:     Scheduled Medications:  aspirin  81 mg Oral Daily   heparin  5,000 Units Subcutaneous Q8H   midodrine  2.5 mg Oral TID WC    sertraline  50 mg Oral Daily   sodium chloride flush  3 mL Intravenous Q12H    Infusions:  sodium chloride     sodium chloride Stopped (07/31/22 1620)    PRN Medications: sodium chloride, acetaminophen, albuterol, ondansetron (ZOFRAN) IV, sodium chloride flush    Patient Profile   Bethany Nichols is a 88y.o. female with PMH of stroke w/ L sided weakness, anxiety, HTN, HLP, and LBBB>CHB s/p saint jude dual-chamber ICD 6/20'. Transfer[facility] after new findings of acute systolic heart failure, EF 30%. AHF team asked to see for further evaluation  Assessment/Plan   Acute systolic heart failure ([place])  - Echo 6/20: EF 60-65%, RV norm sys function, trivial MR and TR, RVSP mildly elevated at 38.7.  - Echo at [facility] 10/2: EF 30-35%, LV with impaired relaxation pattern, mild concentric hypertrophy of LV. Mild MR and TR, PASP 24 - NICM. LHC w/ normal coronaries. RHC w/ low filling pressures and mildly reduced CO, CI 2.3  L/min/m2 - Etiology uncertain, has LBBB but QRS only 132 ms. Also w/ CHB, however only 1% burden RV pacing on device interrogation.  - Not candidate for cMRI with PPM. - Currently likely not candidate for CRT with dementia, QRS only 118m and need for 3 months of GDMT.  - GDMT currently limited by low BP requiring midodrine and AKI  - volume  ok on exam. hold diuretics again today. Will likely need PRN PO Lasix at d/c     Intermittent complete heart block, LBBB - Status post Saint Jude dual-chamber pacemaker implanted 6/22  - Device interrogation revealed: AP 18%, VP 1.2%, no AT/AF, 43 PMT episodes since May - followed by Dr. Curt Bears.   3. HTN - hypotensive in cath lab requiring IVF boluses  - BP improving, 300T systolic. Continue midodrine 2.5 mg bid, up titrate as needed   4. AKI  - SCr 0.88>>1.24 - ? 2/2 contrast given during LHC +/- soft BPs (low 62U systolic overnight)  - euvolemic on exam. hold diuretics again today - avoid  hypotension, continue midodrine - follow BMP   5. Dispo - PT/OT recommending SNF - TOC consulted to assist w/ placement   Length of Stay: 2  Bethany Jester, PA-C  08/02/2022, 7:42 AM  Advanced Heart Failure Team Pager [phone] (M-F; 7a - 5p)  Please contact [facility] for night-coverage after hours (5p -7a ) and weekends on [email or web address]  Patient seen and examined with the above-signed Advanced Practice Provider and/or Housestaff. I personally reviewed laboratory data, imaging studies and relevant notes. I independently examined the patient and formulated the important aspects of the plan. I have edited the note to reflect any of my changes or salient points. I have personally discussed the plan with the patient and/or family.  Sitting up in chair. Remains weak but denies CP or SOB. BP remains soft. Respiratory panel + for rhinovirus  General:  Elderly frail No resp difficulty HEENT: normal Neck: supple. no JVD. Carotids 2+ bilat; no bruits. No lymphadenopathy or thryomegaly appreciated. Cor: PMI nondisplaced. Regular rate & rhythm. No rubs, gallops or murmurs. Lungs: clear Abdomen: soft, nontender, nondistended. No hepatosplenomegaly. No bruits or masses. Good bowel sounds. Extremities: no cyanosis, clubbing, rash, edema Neuro: alert & orientedx3, cranial nerves grossly intact. moves all 4 extremities w/o difficulty. Affect pleasant  Progressing slowly. Volume status stable. BP ok on low-dose midodrine. Respiratory panel + for rhinovirus. ? Viral cardiomyopathy though would expect troponin elevation. No BP room for GDMT currently. May try low-dose spiro soon.   Will need SNF. Possibly tomorrow?   Bethany Bickers, MD  4:38 PM

## 2022-08-03 ENCOUNTER — Other Ambulatory Visit: Payer: Self-pay | Admitting: Cardiology

## 2022-08-03 DIAGNOSIS — I5021 Acute systolic (congestive) heart failure: Secondary | ICD-10-CM | POA: Diagnosis not present

## 2022-08-03 LAB — BASIC METABOLIC PANEL
Anion gap: 8 (ref 5–15)
BUN: 26 mg/dL — ABNORMAL HIGH (ref 8–23)
CO2: 27 mmol/L (ref 22–32)
Calcium: 9.7 mg/dL (ref 8.9–10.3)
Chloride: 99 mmol/L (ref 98–111)
Creatinine, Ser: 0.87 mg/dL (ref 0.44–1.00)
GFR, Estimated: >60 mL/min (ref >60)
Glucose, Bld: 100 mg/dL — ABNORMAL HIGH (ref 70–99)
Potassium: 5 mmol/L (ref 3.5–5.1)
Sodium: 134 mmol/L — ABNORMAL LOW (ref 135–145)

## 2022-08-03 MED ORDER — FUROSEMIDE 20 MG PO TABS: 20.0000 mg | ORAL_TABLET | Freq: Every day | ORAL | 5 refills | Status: AC | PRN

## 2022-08-03 MED ORDER — MIDODRINE HCL 2.5 MG PO TABS: 2.5000 mg | ORAL_TABLET | Freq: Three times a day (TID) | ORAL | 2 refills | Status: AC

## 2022-08-03 NOTE — Progress Notes (Signed)
Patient has order to discharge. IV and telemetry removed. Report given to [facility]. [facility] to pick her up at 1p.

## 2022-08-03 NOTE — Progress Notes (Signed)
   08/03/22 1103  Mobility  Activity Ambulated with assistance in room  Activity Response Tolerated well  Distance Ambulated (ft) 10 ft  $Mobility charge 1 Mobility  Level of Assistance Minimal assist, patient does 75% or more  Assistive Device Front wheel walker  Mobility Referral Yes   Mobility Specialist Progress Note  Received pt in chair having no complaints and agreeable to mobility. Pt was asymptomatic throughout ambulation and returned to room w/o fault. Left in bed w/ call bell in reach and all needs met.   Lucious Groves Mobility Specialist

## 2022-08-03 NOTE — Progress Notes (Deleted)
   08/03/22 1103  Mobility  Activity Ambulated independently in room  Activity Response Tolerated well  Distance Ambulated (ft) 10 ft  $Mobility charge 1 Mobility  Level of Assistance Minimal assist, patient does 75% or more  Assistive Device Front wheel walker  Mobility Referral Yes   Mobility Specialist Progress Note  Received pt in chair having no complaints and agreeable to mobility. Pt was asymptomatic throughout ambulation and returned to room w/o fault. Left in bed w/ call bell in reach and all needs met.   Lucious Groves Mobility Specialist

## 2022-08-03 NOTE — Progress Notes (Signed)
Physical Therapy Treatment Patient Details Name: Bethany Nichols MRN: [id number] DOB: [birth year] Today's Date: 08/03/2022   History of Present Illness 88 y.o. female presents to [facility] hospital on 07/31/2022 with SOB and cough. Pt admitted for management of new onset acute systolic heart failure. PMH includes anxiety, CHB, CVA, depression, HLD, HTN.    PT Comments    Pt tolerates treatment well with improved transfer quality and ambulation tolerance. Pt is able to ambulate for household distances, demonstrating gait deviations which may be attributable to prior CVA. Pt does report gait quality does not feel close to her baseline at this time, and she will benefit from continued aggressive mobilization in an effort to restore independence. PT continues to recommend SNF placement due to limited caregiver support.   Recommendations for follow up therapy are one component of a multi-disciplinary discharge planning process, led by the attending physician.  Recommendations may be updated based on patient status, additional functional criteria and insurance authorization.  Follow Up Recommendations  Skilled nursing-short term rehab (<3 hours/day) Can patient physically be transported by private vehicle: Yes   Assistance Recommended at Discharge Intermittent Supervision/Assistance  Patient can return home with the following A little help with walking and/or transfers;A little help with bathing/dressing/bathroom;Assistance with cooking/housework;Assist for transportation;Help with stairs or ramp for entrance   Equipment Recommendations  BSC/3in1    Recommendations for Other Services       Precautions / Restrictions Precautions Precautions: Fall Restrictions Weight Bearing Restrictions: No     Mobility  Bed Mobility Overal bed mobility: Needs Assistance Bed Mobility: Supine to Sit     Supine to sit: Min assist          Transfers Overall transfer level: Needs assistance Equipment  used: Rolling walker (2 wheels) Transfers: Sit to/from Stand Sit to Stand: Min guard                Ambulation/Gait Ambulation/Gait assistance: Min guard Gait Distance (Feet): 80 Feet Assistive device: Rolling walker (2 wheels) Gait Pattern/deviations: Step-through pattern Gait velocity: reduced Gait velocity interpretation: <1.31 ft/sec, indicative of household ambulator   General Gait Details: slowed step-to gait, LLE external rotation noted consistently during gait cycle   Stairs             Wheelchair Mobility    Modified Rankin (Stroke Patients Only)       Balance Overall balance assessment: Needs assistance Sitting-balance support: No upper extremity supported, Feet supported Sitting balance-Leahy Scale: Good     Standing balance support: Single extremity supported, Bilateral upper extremity supported, Reliant on assistive device for balance Standing balance-Leahy Scale: Poor                              Cognition Arousal/Alertness: Awake/alert Behavior During Therapy: WFL for tasks assessed/performed Overall Cognitive Status: Within Functional Limits for tasks assessed                                          Exercises      General Comments General comments (skin integrity, edema, etc.): VSS on RA      Pertinent Vitals/Pain Pain Assessment Pain Assessment: No/denies pain    Home Living                          Prior Function  PT Goals (current goals can now be found in the care plan section) Acute Rehab PT Goals Patient Stated Goal: to get stronger and not fall Progress towards PT goals: Progressing toward goals    Frequency    Min 3X/week      PT Plan Current plan remains appropriate    Co-evaluation              AM-PAC PT "6 Clicks" Mobility   Outcome Measure  Help needed turning from your back to your side while in a flat bed without using bedrails?: A  Little Help needed moving from lying on your back to sitting on the side of a flat bed without using bedrails?: A Little Help needed moving to and from a bed to a chair (including a wheelchair)?: A Little Help needed standing up from a chair using your arms (e.g., wheelchair or bedside chair)?: A Little Help needed to walk in hospital room?: A Little Help needed climbing 3-5 steps with a railing? : A Lot 6 Click Score: 17    End of Session   Activity Tolerance: Patient tolerated treatment well Patient left: in chair;with call bell/phone within reach;with chair alarm set Nurse Communication: Mobility status PT Visit Diagnosis: Unsteadiness on feet (R26.81);Muscle weakness (generalized) (M62.81)     Time: 7116-5790 PT Time Calculation (min) (ACUTE ONLY): 17 min  Charges:  $Gait Training: 8-22 mins                     Zenaida Niece, PT, DPT Acute Rehabilitation Office [place] 08/03/2022, 1:22 PM

## 2022-08-03 NOTE — TOC Transition Note (Signed)
Transition of Care [facility]) - CM/SW Discharge Note   Patient Details  Name: NOEMY HALLMON MRN: [id number] Date of Birth: Jan 31, 1933  Transition of Care [facility]) CM/SW Contact:  [name], [place] Phone Number: 08/03/2022, 11:05 AM   Clinical Narrative:     Patient will DC to: [place]  Anticipated DC date: 08/03/22 Family notified: Son, [place] Transport by: [facility] [place]([facility])   Per MD patient ready for DC to [facility] SNF. RN, patient, patient's family, and facility notified of DC. Discharge Summary and FL2 sent to facility. RN to call report prior to discharge [phone] room 204). DC packet on chart. [facility] [place]([facility]) transport scheduled for 130p-2p for pickup.   CSW will sign off for now as social work intervention is no longer needed. Please consult [place] again if new needs arise.   Final next level of care: Skilled Nursing Facility Barriers to Discharge: No Barriers Identified   Patient Goals and CMS Choice        Discharge Placement              Patient chooses bed at:  [facility] SN[facility]) Patient to be transferred to facility by: [facility] [place]([facility]) Name of family member notified: Son Joey Patient and family notified of of transfer: 08/03/22  Discharge Plan and Services                                     Social Determinants of Health (SDOH) Interventions     Readmission Risk Interventions     No data to display

## 2022-08-03 NOTE — Progress Notes (Signed)
Remote pacemaker transmission.   

## 2022-08-03 NOTE — Care Management Important Message (Signed)
Important Message  Patient Details  Name: Bethany Nichols MRN: [id number] Date of Birth: 05/07/1933   Medicare Important Message Given:  Yes     Shelda Altes 08/03/2022, 10:09 AM

## 2022-08-03 NOTE — Progress Notes (Signed)
Advanced Heart Failure Rounding Note  PCP-Cardiologist: Dr. Daniel Nones   Subjective:    Breathing ok. No current dyspnea. O2 sats stable on RA.   SBPs ~low 100s- 110s on midodrine 2.5 tid   AKI resolved, SCr 1.24>>0.87. K 5.0   Respiratory panel + for rhinovirus.   Has bed at [facility]   [facility] 10/3 HEMODYNAMICS: RA:                  2 mmHg (mean) RV:                  23/2 mmHg PA:                  19/6 mmHg (14 mean) PCWP:            5 mmHg (mean)                                      Estimated Fick CO/CI   3.6 L/min, 2.3 L/min/m2 AO: 106/69 LVEDP: 5                                              TPG                 9  mmHg                                              PVR                 2.5 Wood Units  PAPi                6.5       IMPRESSION: 1.           Low pre and post capillary filling pressures requiring IVF bolus due to hypotension. Cardiac index mildly reduced likely secondary to both new onset systolic heart failure and low filling pressures. 2.         No significant coronary artery disease. 3.         Tortuous aorta requiring JL5 and Amplatz right 1 for coronary angiography.   Objective:   Weight Range: 60.6 kg Body mass index is 25.24 kg/m.   Vital Signs:   Temp:  [98.3 F (36.8 C)-99.1 F (37.3 C)] 98.3 F (36.8 C) (10/06 0500) Pulse Rate:  [63-84] 68 (10/06 0500) Resp:  [16-23] 23 (10/06 0500) BP: (103-130)/(62-74) 103/63 (10/06 0500) SpO2:  [91 %-96 %] 92 % (10/05 2000) Weight:  [60.6 kg] 60.6 kg (10/06 0000) Last BM Date : 08/01/22  Weight change: Filed Weights   08/01/22 0215 08/02/22 0503 08/03/22 0000  Weight: 56.3 kg 58.4 kg 60.6 kg    Intake/Output:   Intake/Output Summary (Last 24 hours) at 08/03/2022 0802 Last data filed at 08/02/2022 2200 Gross per 24 hour  Intake 783 ml  Output 1000 ml  Net -217 ml      Physical Exam    General:  elderly frail Female. No respiratory difficulty HEENT: normal Neck: supple. JVD not  elevated Carotids 2+ bilat; no bruits. No lymphadenopathy or thyromegaly appreciated. Cor: PMI nondisplaced. Regular rate & rhythm. No rubs, gallops  or murmurs. Lungs: clear Abdomen: soft, nontender, nondistended. No hepatosplenomegaly. No bruits or masses. Good bowel sounds. Extremities: no cyanosis, clubbing, rash, edema Neuro: alert & oriented x 3, cranial nerves grossly intact. moves all 4 extremities w/o difficulty. Affect pleasant.    Telemetry   NSR 70s  w/ LBBB   EKG    N/A   Labs    CBC Recent Labs    07/31/22 1419 07/31/22 1752  WBC  --  9.8  NEUTROABS  --  6.7  HGB 14.3 13.6  HCT 42.0 41.5  MCV  --  90.6  PLT  --  299   Basic Metabolic Panel Recent Labs    08/02/22 0032 08/03/22 0051  NA 134* 134*  K 4.5 5.0  CL 103 99  CO2 25 27  GLUCOSE 99 100*  BUN 29* 26*  CREATININE 1.24* 0.87  CALCIUM 8.7* 9.7   Liver Function Tests Recent Labs    07/31/22 1752  AST 24  ALT 15  ALKPHOS 54  BILITOT 0.7  PROT 6.3*  ALBUMIN 3.3*   No results for input(s): "LIPASE", "AMYLASE" in the last 72 hours. Cardiac Enzymes No results for input(s): "CKTOTAL", "CKMB", "CKMBINDEX", "TROPONINI" in the last 72 hours.  BNP: BNP (last 3 results) Recent Labs    07/31/22 1752  BNP 702.2*    ProBNP (last 3 results) No results for input(s): "PROBNP" in the last 8760 hours.   D-Dimer No results for input(s): "DDIMER" in the last 72 hours. Hemoglobin A1C No results for input(s): "HGBA1C" in the last 72 hours. Fasting Lipid Panel No results for input(s): "CHOL", "HDL", "LDLCALC", "TRIG", "CHOLHDL", "LDLDIRECT" in the last 72 hours. Thyroid Function Tests Recent Labs    07/31/22 1752  TSH 4.185    Other results:   Imaging    No results found.   Medications:     Scheduled Medications:  aspirin  81 mg Oral Daily   heparin  5,000 Units Subcutaneous Q8H   midodrine  2.5 mg Oral TID WC   sertraline  50 mg Oral Daily   sodium chloride flush  3 mL  Intravenous Q12H    Infusions:  sodium chloride     sodium chloride Stopped (07/31/22 1620)    PRN Medications: sodium chloride, acetaminophen, albuterol, guaiFENesin-dextromethorphan, ondansetron (ZOFRAN) IV, sodium chloride flush    Patient Profile   Ms. Gadbois is a 88y.o. female with PMH of stroke w/ L sided weakness, anxiety, HTN, HLP, and LBBB>CHB s/p [employer] dual-chamber ICD 6/20'. Transferred to [facility] from [facility] hospital after new findings of acute systolic heart failure, EF 30%. AHF team asked to see for further evaluation  Assessment/Plan   Acute systolic heart failure (New)  - Echo 6/20: EF 60-65%, RV norm sys function, trivial MR and TR, RVSP mildly elevated at 38.7.  - Echo at [facility] 10/2: EF 30-35%, LV with impaired relaxation pattern, mild concentric hypertrophy of LV. Mild MR and TR, PASP 24 - NICM. LHC w/ normal coronaries. RHC w/ low filling pressures and mildly reduced CO, CI 2.3  L/min/m2 - Etiology uncertain, has LBBB but QRS only 132 ms. Also w/ CHB, however only 1% burden RV pacing on device interrogation. Respiratory panel + for rhinovirus. ? Viral cardiomyopathy though would expect troponin elevation - Not candidate for cMRI with PPM. - Currently likely not candidate for CRT with dementia, QRS only 138m and need for 3 months of GDMT.  - GDMT currently limited by low BP requiring midodrine and borderline high K (  5.0)  - volume ok on exam. hold diuretics again today. Will need PRN Lasix at d/c, 20 mg    - can consider addition of SLGT2i at outpatient f/u if volume ok. Will not initiate now given risk for volume depletion    Intermittent complete heart block, LBBB - Status post [employer] dual-chamber pacemaker implanted 6/22  - Device interrogation revealed: AP 18%, VP 1.2%, no AT/AF, 43 PMT episodes since May - followed by Dr. Curt Bears.   3. HTN - hypotensive in cath lab requiring IVF boluses  - BP improving, 751Z systolic. Continue midodrine 2.5  mg bid, up titrate as needed   4. AKI  - resolved  - SCr 0.88>>1.24>>0.87  - euvolemic on exam. Hold diuretics again today - avoid hypotension, continue midodrine - follow BMP   5. Rhinovirus  - supportive care   6. Dispo - PT/OT recommending SNF - Bed available to [facility]   Length of Stay: [address], [place]  08/03/2022, 8:02 AM  Advanced Heart Failure Team Pager [phone] (M-F; 7a - 5p)  Please contact [facility] for night-coverage after hours (5p -7a ) and weekends on [email or web address]

## 2022-08-14 ENCOUNTER — Encounter (HOSPITAL_COMMUNITY): Payer: No Typology Code available for payment source

## 2022-10-14 ENCOUNTER — Inpatient Hospital Stay (HOSPITAL_COMMUNITY): Payer: No Typology Code available for payment source

## 2022-10-14 ENCOUNTER — Encounter (HOSPITAL_COMMUNITY): Payer: Self-pay | Admitting: Internal Medicine

## 2022-10-14 ENCOUNTER — Other Ambulatory Visit: Payer: Self-pay

## 2022-10-14 ENCOUNTER — Emergency Department (HOSPITAL_COMMUNITY): Payer: No Typology Code available for payment source

## 2022-10-14 ENCOUNTER — Inpatient Hospital Stay (HOSPITAL_COMMUNITY)
Admission: EM | Admit: 2022-10-14 | Discharge: 2022-10-18 | DRG: 177 | Disposition: A | Discharge status: Skilled Nursing Facility | Payer: No Typology Code available for payment source | Attending: Internal Medicine | Admitting: Internal Medicine

## 2022-10-14 DIAGNOSIS — J440 Chronic obstructive pulmonary disease with acute lower respiratory infection: Secondary | ICD-10-CM | POA: Diagnosis present

## 2022-10-14 DIAGNOSIS — R627 Adult failure to thrive: Secondary | ICD-10-CM | POA: Diagnosis present

## 2022-10-14 DIAGNOSIS — Z66 Do not resuscitate: Secondary | ICD-10-CM | POA: Diagnosis present

## 2022-10-14 DIAGNOSIS — E785 Hyperlipidemia, unspecified: Secondary | ICD-10-CM | POA: Diagnosis present

## 2022-10-14 DIAGNOSIS — Z9981 Dependence on supplemental oxygen: Secondary | ICD-10-CM | POA: Diagnosis not present

## 2022-10-14 DIAGNOSIS — S0181XA Laceration without foreign body of other part of head, initial encounter: Secondary | ICD-10-CM | POA: Diagnosis present

## 2022-10-14 DIAGNOSIS — I110 Hypertensive heart disease with heart failure: Secondary | ICD-10-CM | POA: Diagnosis present

## 2022-10-14 DIAGNOSIS — R296 Repeated falls: Secondary | ICD-10-CM | POA: Diagnosis present

## 2022-10-14 DIAGNOSIS — Z888 Allergy status to other drugs, medicaments and biological substances status: Secondary | ICD-10-CM | POA: Diagnosis not present

## 2022-10-14 DIAGNOSIS — I6930 Unspecified sequelae of cerebral infarction: Secondary | ICD-10-CM

## 2022-10-14 DIAGNOSIS — I442 Atrioventricular block, complete: Secondary | ICD-10-CM | POA: Diagnosis present

## 2022-10-14 DIAGNOSIS — Z7982 Long term (current) use of aspirin: Secondary | ICD-10-CM

## 2022-10-14 DIAGNOSIS — I69354 Hemiplegia and hemiparesis following cerebral infarction affecting left non-dominant side: Secondary | ICD-10-CM | POA: Diagnosis not present

## 2022-10-14 DIAGNOSIS — I5022 Chronic systolic (congestive) heart failure: Secondary | ICD-10-CM | POA: Diagnosis present

## 2022-10-14 DIAGNOSIS — W19XXXA Unspecified fall, initial encounter: Secondary | ICD-10-CM | POA: Diagnosis present

## 2022-10-14 DIAGNOSIS — J1282 Pneumonia due to coronavirus disease 2019: Secondary | ICD-10-CM | POA: Diagnosis present

## 2022-10-14 DIAGNOSIS — I959 Hypotension, unspecified: Secondary | ICD-10-CM | POA: Diagnosis present

## 2022-10-14 DIAGNOSIS — Z6824 Body mass index (BMI) 24.0-24.9, adult: Secondary | ICD-10-CM | POA: Diagnosis not present

## 2022-10-14 DIAGNOSIS — Z87891 Personal history of nicotine dependence: Secondary | ICD-10-CM | POA: Diagnosis not present

## 2022-10-14 DIAGNOSIS — U071 COVID-19: Principal | ICD-10-CM | POA: Diagnosis present

## 2022-10-14 DIAGNOSIS — J9621 Acute and chronic respiratory failure with hypoxia: Secondary | ICD-10-CM | POA: Diagnosis present

## 2022-10-14 DIAGNOSIS — F32A Depression, unspecified: Secondary | ICD-10-CM | POA: Diagnosis present

## 2022-10-14 DIAGNOSIS — Z79899 Other long term (current) drug therapy: Secondary | ICD-10-CM

## 2022-10-14 DIAGNOSIS — J449 Chronic obstructive pulmonary disease, unspecified: Secondary | ICD-10-CM | POA: Diagnosis present

## 2022-10-14 DIAGNOSIS — Z950 Presence of cardiac pacemaker: Secondary | ICD-10-CM | POA: Diagnosis not present

## 2022-10-14 DIAGNOSIS — F419 Anxiety disorder, unspecified: Secondary | ICD-10-CM | POA: Diagnosis present

## 2022-10-14 DIAGNOSIS — M549 Dorsalgia, unspecified: Secondary | ICD-10-CM | POA: Diagnosis present

## 2022-10-14 LAB — C-REACTIVE PROTEIN: CRP: <0.5 mg/dL (ref <1.0)

## 2022-10-14 LAB — URINALYSIS, ROUTINE W REFLEX MICROSCOPIC
Bilirubin Urine: NEGATIVE
Glucose, UA: NEGATIVE mg/dL
Hgb urine dipstick: NEGATIVE
Ketones, ur: NEGATIVE mg/dL
Nitrite: NEGATIVE
Protein, ur: NEGATIVE mg/dL
Specific Gravity, Urine: 1.017 (ref 1.005–1.030)
pH: 5 (ref 5.0–8.0)

## 2022-10-14 LAB — COMPREHENSIVE METABOLIC PANEL
ALT: 24 U/L (ref 0–44)
AST: 34 U/L (ref 15–41)
Albumin: 3.3 g/dL — ABNORMAL LOW (ref 3.5–5.0)
Alkaline Phosphatase: 56 U/L (ref 38–126)
Anion gap: 10 (ref 5–15)
BUN: 24 mg/dL — ABNORMAL HIGH (ref 8–23)
CO2: 25 mmol/L (ref 22–32)
Calcium: 8.9 mg/dL (ref 8.9–10.3)
Chloride: 94 mmol/L — ABNORMAL LOW (ref 98–111)
Creatinine, Ser: 0.82 mg/dL (ref 0.44–1.00)
GFR, Estimated: >60 mL/min (ref >60)
Glucose, Bld: 104 mg/dL — ABNORMAL HIGH (ref 70–99)
Potassium: 3.6 mmol/L (ref 3.5–5.1)
Sodium: 129 mmol/L — ABNORMAL LOW (ref 135–145)
Total Bilirubin: 0.7 mg/dL (ref 0.3–1.2)
Total Protein: 6.7 g/dL (ref 6.5–8.1)

## 2022-10-14 LAB — CBC
HCT: 37.6 % (ref 36.0–46.0)
Hemoglobin: 12.6 g/dL (ref 12.0–15.0)
MCH: 29.7 pg (ref 26.0–34.0)
MCHC: 33.5 g/dL (ref 30.0–36.0)
MCV: 88.7 fL (ref 80.0–100.0)
Platelets: 318 10*3/uL (ref 150–400)
RBC: 4.24 MIL/uL (ref 3.87–5.11)
RDW: 14.7 % (ref 11.5–15.5)
WBC: 12.7 10*3/uL — ABNORMAL HIGH (ref 4.0–10.5)
nRBC: 0 % (ref 0.0–0.2)

## 2022-10-14 LAB — LACTATE DEHYDROGENASE: LDH: 207 U/L — ABNORMAL HIGH (ref 98–192)

## 2022-10-14 LAB — RESP PANEL BY RT-PCR (RSV, FLU A&B, COVID)  RVPGX2
Influenza A by PCR: NEGATIVE
Influenza B by PCR: NEGATIVE
Resp Syncytial Virus by PCR: NEGATIVE
SARS Coronavirus 2 by RT PCR: POSITIVE — AB

## 2022-10-14 LAB — D-DIMER, QUANTITATIVE: D-Dimer, Quant: 3.24 ug/mL-FEU — ABNORMAL HIGH (ref 0.00–0.50)

## 2022-10-14 LAB — FERRITIN: Ferritin: 102 ng/mL (ref 11–307)

## 2022-10-14 LAB — FIBRINOGEN: Fibrinogen: 446 mg/dL (ref 210–475)

## 2022-10-14 LAB — PROCALCITONIN: Procalcitonin: <0.1 ng/mL

## 2022-10-14 LAB — TROPONIN I (HIGH SENSITIVITY): Troponin I (High Sensitivity): 26 ng/L — ABNORMAL HIGH (ref <18)

## 2022-10-14 MED ORDER — SODIUM CHLORIDE 0.9 % IV SOLN: INTRAVENOUS | Status: DC | PRN

## 2022-10-14 MED ORDER — FLEET ENEMA 7-19 GM/118ML RE ENEM: 1.0000 | ENEMA | Freq: Once | RECTAL | Status: DC | PRN

## 2022-10-14 MED ORDER — GUAIFENESIN-DM 100-10 MG/5ML PO SYRP
10.0000 mL | ORAL_SOLUTION | ORAL | Status: DC | PRN
  Administered 2022-10-16: 10 mL via ORAL
  Filled 2022-10-14: qty 10

## 2022-10-14 MED ORDER — METHOCARBAMOL 1000 MG/10ML IJ SOLN
500.0000 mg | Freq: Four times a day (QID) | INTRAVENOUS | Status: DC | PRN
  Filled 2022-10-14: qty 5

## 2022-10-14 MED ORDER — SERTRALINE HCL 25 MG PO TABS
150.0000 mg | ORAL_TABLET | Freq: Every day | ORAL | Status: DC
  Administered 2022-10-14 – 2022-10-17 (×4): 150 mg via ORAL
  Filled 2022-10-14 (×3): qty 2
  Filled 2022-10-14: qty 1
  Filled 2022-10-14: qty 2

## 2022-10-14 MED ORDER — ENOXAPARIN SODIUM 40 MG/0.4ML IJ SOSY
40.0000 mg | PREFILLED_SYRINGE | INTRAMUSCULAR | Status: DC
  Administered 2022-10-14 – 2022-10-17 (×4): 40 mg via SUBCUTANEOUS
  Filled 2022-10-14 (×4): qty 0.4

## 2022-10-14 MED ORDER — ACETAMINOPHEN 325 MG PO TABS
650.0000 mg | ORAL_TABLET | Freq: Four times a day (QID) | ORAL | Status: DC | PRN
  Administered 2022-10-14: 650 mg via ORAL
  Filled 2022-10-14 (×2): qty 2

## 2022-10-14 MED ORDER — ENOXAPARIN SODIUM 40 MG/0.4ML IJ SOSY: 40.0000 mg | PREFILLED_SYRINGE | INTRAMUSCULAR | Status: DC

## 2022-10-14 MED ORDER — DOCUSATE SODIUM 100 MG PO CAPS
100.0000 mg | ORAL_CAPSULE | Freq: Two times a day (BID) | ORAL | Status: DC
  Administered 2022-10-14 – 2022-10-18 (×8): 100 mg via ORAL
  Filled 2022-10-14 (×8): qty 1

## 2022-10-14 MED ORDER — ONDANSETRON HCL 4 MG/2ML IJ SOLN
4.0000 mg | Freq: Four times a day (QID) | INTRAMUSCULAR | Status: DC | PRN
  Administered 2022-10-16: 4 mg via INTRAVENOUS
  Filled 2022-10-14: qty 2

## 2022-10-14 MED ORDER — ONDANSETRON HCL 4 MG PO TABS: 4.0000 mg | ORAL_TABLET | Freq: Four times a day (QID) | ORAL | Status: DC | PRN

## 2022-10-14 MED ORDER — LIDOCAINE 5 % EX PTCH
3.0000 | MEDICATED_PATCH | CUTANEOUS | Status: DC
  Administered 2022-10-14 – 2022-10-17 (×4): 3 via TRANSDERMAL
  Filled 2022-10-14 (×4): qty 3

## 2022-10-14 MED ORDER — MEMANTINE HCL 10 MG PO TABS
5.0000 mg | ORAL_TABLET | Freq: Two times a day (BID) | ORAL | Status: DC
  Administered 2022-10-14 – 2022-10-18 (×8): 5 mg via ORAL
  Filled 2022-10-14 (×9): qty 1

## 2022-10-14 MED ORDER — METHYLPREDNISOLONE SODIUM SUCC 40 MG IJ SOLR
0.5000 mg/kg | Freq: Two times a day (BID) | INTRAMUSCULAR | Status: AC
  Administered 2022-10-14 – 2022-10-17 (×6): 30 mg via INTRAVENOUS
  Filled 2022-10-14 (×5): qty 0.75
  Filled 2022-10-14: qty 1

## 2022-10-14 MED ORDER — ALBUTEROL SULFATE HFA 108 (90 BASE) MCG/ACT IN AERS: 2.0000 | INHALATION_SPRAY | RESPIRATORY_TRACT | Status: DC | PRN

## 2022-10-14 MED ORDER — ASPIRIN 81 MG PO TBEC
81.0000 mg | DELAYED_RELEASE_TABLET | Freq: Every day | ORAL | Status: DC
  Administered 2022-10-15 – 2022-10-18 (×4): 81 mg via ORAL
  Filled 2022-10-14 (×4): qty 1

## 2022-10-14 MED ORDER — SODIUM CHLORIDE 0.9% FLUSH: 3.0000 mL | INTRAVENOUS | Status: DC | PRN

## 2022-10-14 MED ORDER — ZINC SULFATE 220 (50 ZN) MG PO CAPS
220.0000 mg | ORAL_CAPSULE | Freq: Every day | ORAL | Status: DC
  Administered 2022-10-14 – 2022-10-18 (×5): 220 mg via ORAL
  Filled 2022-10-14 (×5): qty 1

## 2022-10-14 MED ORDER — PRESERVISION AREDS 2 PO CAPS: 1.0000 | ORAL_CAPSULE | Freq: Two times a day (BID) | ORAL | Status: DC

## 2022-10-14 MED ORDER — ACETAMINOPHEN 500 MG PO TABS
1000.0000 mg | ORAL_TABLET | Freq: Once | ORAL | Status: AC
  Administered 2022-10-14: 1000 mg via ORAL
  Filled 2022-10-14: qty 2

## 2022-10-14 MED ORDER — BISACODYL 5 MG PO TBEC: 5.0000 mg | DELAYED_RELEASE_TABLET | Freq: Every day | ORAL | Status: DC | PRN

## 2022-10-14 MED ORDER — MIDODRINE HCL 5 MG PO TABS
2.5000 mg | ORAL_TABLET | Freq: Three times a day (TID) | ORAL | Status: DC
  Administered 2022-10-15 – 2022-10-18 (×10): 2.5 mg via ORAL
  Filled 2022-10-14 (×10): qty 1

## 2022-10-14 MED ORDER — POLYETHYLENE GLYCOL 3350 17 G PO PACK
17.0000 g | PACK | Freq: Every day | ORAL | Status: DC | PRN
  Administered 2022-10-18: 17 g via ORAL
  Filled 2022-10-14: qty 1

## 2022-10-14 MED ORDER — PREDNISONE 20 MG PO TABS: 50.0000 mg | ORAL_TABLET | Freq: Every day | ORAL | Status: DC

## 2022-10-14 MED ORDER — DEXAMETHASONE SODIUM PHOSPHATE 10 MG/ML IJ SOLN
6.0000 mg | Freq: Once | INTRAMUSCULAR | Status: AC
  Administered 2022-10-14: 6 mg via INTRAVENOUS
  Filled 2022-10-14: qty 1

## 2022-10-14 MED ORDER — VITAMIN C 500 MG PO TABS
500.0000 mg | ORAL_TABLET | Freq: Every day | ORAL | Status: DC
  Administered 2022-10-14 – 2022-10-18 (×5): 500 mg via ORAL
  Filled 2022-10-14 (×5): qty 1

## 2022-10-14 MED ORDER — SODIUM CHLORIDE 0.9% FLUSH
3.0000 mL | Freq: Two times a day (BID) | INTRAVENOUS | Status: DC
  Administered 2022-10-15 – 2022-10-18 (×7): 3 mL via INTRAVENOUS

## 2022-10-14 MED ORDER — OXYCODONE HCL 5 MG PO TABS
5.0000 mg | ORAL_TABLET | ORAL | Status: DC | PRN
  Administered 2022-10-15 – 2022-10-18 (×3): 5 mg via ORAL
  Filled 2022-10-14 (×3): qty 1

## 2022-10-14 MED ORDER — HYDROCOD POLI-CHLORPHE POLI ER 10-8 MG/5ML PO SUER: 5.0000 mL | Freq: Two times a day (BID) | ORAL | Status: DC | PRN

## 2022-10-14 NOTE — ED Triage Notes (Signed)
Patient arrived with EMS from home reports SOB this morning , patient also reported that she fell last week , presents with nasal swelling , bruise at chest and mid back .

## 2022-10-14 NOTE — ED Notes (Signed)
Dr Kennon Holter for permission to give pt water

## 2022-10-14 NOTE — ED Notes (Signed)
Messaged MD for medication for pain the pt is requesting

## 2022-10-14 NOTE — ED Notes (Signed)
Vicky at [facility] would like an update when able. [phone] opt 2

## 2022-10-14 NOTE — H&P (Signed)
History and Physical    Patient: Bethany Nichols:[id number] DOB: 06-04-1933 DOA: 10/14/2022 DOS: the patient was seen and examined on 10/14/2022 PCP: [facility]  Patient coming from: Home - lives alone; NOK: Bethany Nichols, [phone]   Chief Complaint: weakness  HPI: Bethany Nichols is a 88 y.o. female with medical history significant of pacemaker placement; CVA; HTN; HLD; and anxiety/depression presenting with a fall yesterday, SOB today.   She reports that she developed COVID symptoms about 9 days ago and tested positive on 12/9.  She usually wears 1L O2 qhs but has been SOB and has needed continuous NCO2.  She also has been weak and has been falling.  She hurt her nose in 1 fall.  She is having diffuse chest and back pain and the chest pain is quite reproducible.      ER Course:  SON, falls, FTT.  Diagnosed with COVID last Wednesday.  Wears 1L nocturnal O2, wearing 3L all week.  Fall with nasal arch fracture.  Currently on 2L with tachypnea.  CTA pending.  COVID +.  Giving dexamethasone. Symptoms started Tuesday, out of the window for treatment.     Review of Systems: As mentioned in the history of present illness. All other systems reviewed and are negative. Past Medical History:  Diagnosis Date   Anxiety    Complete heart block (HCC)    CVA (cerebral vascular accident) ([place])    Depression    Hyperlipidemia    Hypertension    Past Surgical History:  Procedure Laterality Date   PACEMAKER IMPLANT N/A 04/16/2019   Procedure: PACEMAKER IMPLANT;  Surgeon: Constance Haw, MD;  Location: [facility];  Service: Cardiovascular;  Laterality: N/A;   RIGHT/LEFT HEART CATH AND CORONARY ANGIOGRAPHY N/A 07/31/2022   Procedure: RIGHT/LEFT HEART CATH AND CORONARY ANGIOGRAPHY;  Surgeon: Jolaine Artist, MD;  Location: [facility];  Service: Cardiovascular;  Laterality: N/A;   Social History:  reports that she has never smoked. She has  never used smokeless tobacco. No history on file for alcohol use and drug use.  Allergies  Allergen Reactions   Levaquin [Levofloxacin] Rash   Allegra [Fexofenadine] Other (See Comments)    Unknown reaction    Family History  Problem Relation Age of Onset   Breast cancer Mother     Prior to Admission medications   Medication Sig Start Date End Date Taking? Authorizing Provider  acetaminophen (TYLENOL) 325 MG tablet Take 650 mg by mouth every 8 (eight) hours as needed for fever (pain).    [provider]  albuterol (VENTOLIN HFA) 108 (90 Base) MCG/ACT inhaler Inhale 2 puffs into the lungs every 4 (four) hours as needed for wheezing or shortness of breath.    [provider]  alendronate (FOSAMAX) 70 MG tablet Take 70 mg by mouth every Wednesday.    [provider]  aspirin EC 81 MG tablet Take 81 mg by mouth daily.    [provider]  Camphor-Eucalyptus-Menthol (CHEST RUB) 4.8-1.2-2.6 % OINT Apply 1 application  topically 2 (two) times daily as needed (rash).    [provider]  Emollient (CERAVE DAILY MOISTURIZING) LOTN Apply 1 application  topically 2 (two) times daily as needed (dry skin).    [provider]  fluticasone (FLONASE) 50 MCG/ACT nasal spray Place 1 spray into both nostrils 2 (two) times daily as needed for allergies.    [provider]  furosemide (LASIX) 20 MG tablet Take 1 tablet (  20 mg total) by mouth daily as needed. Take for weight gain > 3 lb in 24 hr or > 5 lb in 1 week or for ankle swelling 08/03/22 08/03/23  Lyda Jester M, PA-C  guaiFENesin (ROBITUSSIN) 100 MG/5ML liquid Take 200 mg by mouth every 4 (four) hours as needed for cough.    [provider]  memantine (NAMENDA) 5 MG tablet Take 5 mg by mouth 2 (two) times daily.    [provider]  Menthol-Methyl Salicylate (MUSCLE RUB) 10-15 % CREA Apply 1 application topically 3 (three) times daily as needed for muscle pain.     [provider]  Menthol-Methyl Salicylate (MUSCLE RUB) 10-15 % CREA Apply 1 Application topically 3 (three) times daily as needed for muscle pain.    [provider]  midodrine (PROAMATINE) 2.5 MG tablet Take 1 tablet (2.5 mg total) by mouth 3 (three) times daily with meals. 08/03/22   Consuelo Pandy, PA-C  Multiple Vitamins-Minerals (PRESERVISION AREDS 2) CAPS Take 1 capsule by mouth 2 (two) times daily.    [provider]  sertraline (ZOLOFT) 100 MG tablet Take 150 mg by mouth at bedtime.    [provider]    Physical Exam: Vitals:   10/14/22 1500 10/14/22 1515 10/14/22 1530 10/14/22 1545  BP: 137/73 129/70 135/78 130/72  Pulse: 61 62 61 60  Resp: '18 18 17 19  '$ Temp:      TempSrc:      SpO2: 94% 94% 94% 94%   General:  Appears calm and comfortable and is in NAD, on [place] O2; frail Eyes: EOMI, normal lids, iris ENT:  grossly normal hearing, lips & tongue, mmm; edentulous; nasal bridge trauma Neck:  no LAD, masses or thyromegaly Cardiovascular:  RRR, no m/r/g. No LE edema.  Respiratory:  Mild diffuse rhonchi.  Normal respiratory effort.  Chest wall TTP. Abdomen:  soft, NT, ND Skin:  no rash or induration seen on limited exam; nasal bridge trauma Musculoskeletal:  grossly normal tone BUE/BLE, good ROM, no bony abnormality Psychiatric:  blunted mood and affect, speech fluent and appropriate, AOx3 Neurologic:  CN 2-12 grossly intact, moves all extremities in coordinated fashion   Radiological Exams on Admission: Independently reviewed - see discussion in A/P where applicable  DG Shoulder Left  Result Date: 10/14/2022 CLINICAL DATA:  88 year old female status post fall yesterday 1600 hours. Pain. EXAM: LEFT SHOULDER - 2+ VIEW COMPARISON:  Chest radiographs today. Left shoulder series 06/13/2015. FINDINGS: No glenohumeral joint dislocation. Joint spaces and alignment appear normal for age. No fracture of the left clavicle, scapula, or proximal  humerus identified. Left chest cardiac pacemaker. Left lung interstitial opacity, see Chest series today reported separately. IMPRESSION: No acute fracture or dislocation identified about the left shoulder. Electronically Signed   By: Genevie Ann M.D.   On: 10/14/2022 08:48   DG Shoulder Right  Result Date: 10/14/2022 CLINICAL DATA:  88 year old female status post fall yesterday 1600 hours. Pain. EXAM: RIGHT SHOULDER - 2+ VIEW COMPARISON:  Right shoulder series 06/13/2015. Chest radiographs today. FINDINGS: No glenohumeral joint dislocation. Osteopenia. No fracture of the visible right clavicle, scapula, or proximal right humerus. Joint spaces and alignment appear normal for age. Stable visible right chest as described on the dedicated chest series today. IMPRESSION: No acute fracture or dislocation identified about the right shoulder. Electronically Signed   By: Genevie Ann M.D.   On: 10/14/2022 08:47   DG Chest 2 View  Result Date: 10/14/2022 CLINICAL DATA:  88 year old female  status post fall yesterday 1600 hours. Pain. EXAM: CHEST - 2 VIEW COMPARISON:  Thoracic spine CT today.  Portable chest 07/30/2022. FINDINGS: AP and lateral views at 0751 hours. There is cardiomegaly. Other mediastinal contours are within normal limits. Chronic left chest pacemaker. Fairly diffuse increased bilateral pulmonary interstitial opacity compared to October. No pneumothorax. No pleural effusion is evident. No consolidation. Thoracic spine detailed separately.  Negative visible bowel gas. IMPRESSION: 1. Fairly diffuse increased bilateral pulmonary interstitial opacity as described on thoracic spine CT today. Underlying cardiomegaly favors pulmonary edema but viral/atypical respiratory infection not excluded. 2. Thoracic spine detailed separately on CT. Electronically Signed   By: Genevie Ann M.D.   On: 10/14/2022 08:46   DG Hip Unilat W or Wo Pelvis 2-3 Views Left  Result Date: 10/14/2022 CLINICAL DATA:  88 year old female status  post fall yesterday 1600 hours. Pain. EXAM: DG HIP (WITH OR WITHOUT PELVIS) 2-3V LEFT COMPARISON:  CT Abdomen and Pelvis 05/18/2015. Lumbar spine CT today. FINDINGS: Bone mineralization is within normal limits for age. Femoral heads are normally located. Pelvis appears intact with some symphysis degeneration. Grossly intact proximal right femur. Dedicated left hip AP and lateral views. Proximal left femur appears intact. Retained stool but otherwise negative lower abdominal and pelvic visceral contours. IMPRESSION: No acute fracture or dislocation identified about the left hip or pelvis. Electronically Signed   By: Genevie Ann M.D.   On: 10/14/2022 08:44   CT Lumbar Spine Wo Contrast  Result Date: 10/14/2022 CLINICAL DATA:  88 year old female status post fall yesterday 1600 hours. Laceration to nose, contusion to forehead. EXAM: CT LUMBAR SPINE WITHOUT CONTRAST TECHNIQUE: Multidetector CT imaging of the lumbar spine was performed without intravenous contrast administration. Multiplanar CT image reconstructions were also generated. RADIATION DOSE REDUCTION: This exam was performed according to the departmental dose-optimization program which includes automated exposure control, adjustment of the mA and/or kV according to patient size and/or use of iterative reconstruction technique. COMPARISON:  CT thoracic spine today. [facility] CT Abdomen and Pelvis 05/18/2015. FINDINGS: Segmentation: Normal, concordant with the thoracic numbering today. Alignment: Relatively normal lumbar lordosis. Subtle anterolisthesis of L4 on L5. Vertebrae: No prior sagittal images available with the 2016 CT Abdomen and Pelvis. Osteopenia. Lumbar vertebrae appear intact. Visible sacrum and SI joints appear intact. No acute osseous abnormality identified. Paraspinal and other soft tissues: Aortoiliac calcified atherosclerosis. Retained large bowel stool. Negative other visible noncontrast abdominal viscera. Lumbar paraspinal soft  tissues are within normal limits. Disc levels: Mild for age lumbar spine degeneration, primarily facet arthropathy. Comparatively mild lumbar disc bulging. No CT evidence of lumbar spinal stenosis. IMPRESSION: 1. No acute traumatic injury identified in the Lumbar Spine. 2. Mild for age lumbar spine degeneration. 3.  Aortic Atherosclerosis (ICD10-I70.0). Electronically Signed   By: Genevie Ann M.D.   On: 10/14/2022 08:32   CT Thoracic Spine Wo Contrast  Result Date: 10/14/2022 CLINICAL DATA:  88 year old female status post fall yesterday 1600 hours. Laceration to nose, contusion to forehead. EXAM: CT THORACIC SPINE WITHOUT CONTRAST TECHNIQUE: Multidetector CT images of the thoracic were obtained using the standard protocol without intravenous contrast. RADIATION DOSE REDUCTION: This exam was performed according to the departmental dose-optimization program which includes automated exposure control, adjustment of the mA and/or kV according to patient size and/or use of iterative reconstruction technique. COMPARISON:  Cervical spine CT today.  Chest CTA 05/16/2017. FINDINGS: Limited cervical spine imaging:  Detailed separately. Thoracic spine segmentation:  Appears to be normal. Alignment: Chronic but progressed exaggerated thoracic  kyphosis since 2018. No significant spondylolisthesis or scoliosis. Vertebrae: Diffuse chronic osteopenia. Mild chronic T1, T2, and T4 compression fractures. Moderate to severe chronic T7 compression fracture, only mild loss of height there since 2018. No superimposed acute osseous abnormality identified in the thoracic spine. Intermittent chronic posterior rib fractures. No visible acute rib fracture identified. Paraspinal and other soft tissues: Left chest pacemaker. Extensive Calcified aortic atherosclerosis. Chronic cardiomegaly appears progressed since 2018. No pericardial effusion. Small, trace layering left pleural effusion is new. Respiratory motion and atelectatic changes to the  major airways which remain patent. Asymmetric patchy and widespread bilateral pulmonary ground-glass opacity with some septal thickening. No consolidation. Negative visible noncontrast upper abdominal viscera. Thoracic paraspinal soft tissues appear within normal limits. Disc levels: Mild for age thoracic spine degeneration and no CT evidence of thoracic spinal stenosis despite some chronic compression fractures as stated above. IMPRESSION: 1. No acute traumatic injury identified in the Thoracic Spine. Osteopenia with chronic T1, T2, T4, T7 compression fractures. Chronic posterior rib fractures. 2. Chronic but increased cardiomegaly since 2018, with trace layering left pleural effusion, and widespread pulmonary ground-glass opacity with some septal thickening. This is indeterminate for pulmonary edema versus viral/atypical respiratory infection (including COVID-19). 3.  Aortic Atherosclerosis (ICD10-I70.0). Electronically Signed   By: Genevie Ann M.D.   On: 10/14/2022 08:29   CT Cervical Spine Wo Contrast  Result Date: 10/14/2022 CLINICAL DATA:  88 year old female status post fall yesterday 1600 hours. Laceration to nose, contusion to forehead. EXAM: CT CERVICAL SPINE WITHOUT CONTRAST TECHNIQUE: Multidetector CT imaging of the cervical spine was performed without intravenous contrast. Multiplanar CT image reconstructions were also generated. RADIATION DOSE REDUCTION: This exam was performed according to the departmental dose-optimization program which includes automated exposure control, adjustment of the mA and/or kV according to patient size and/or use of iterative reconstruction technique. COMPARISON:  Cervical spine CT 06/13/2015. FINDINGS: Alignment: Stable cervical lordosis since 2016. Cervicothoracic junction alignment is within normal limits. Bilateral posterior element alignment is within normal limits. Skull base and vertebrae: Visualized skull base is intact. No atlanto-occipital dissociation. C1 and C2  appear intact and aligned. No acute osseous abnormality identified. Soft tissues and spinal canal: No prevertebral fluid or swelling. No visible canal hematoma. Right carotid calcified atherosclerosis but otherwise negative visible noncontrast neck soft tissues. Disc levels: Mild for age cervical spine degeneration appears stable since 2016, with fairly capacious spinal canal. Upper chest: Thoracic spine is detailed separately today, but chronic T1, T2, and T3 superior endplate compression appears stable since 2016. Patchy and confluent, somewhat geometric areas of abnormal pulmonary ground-glass opacity with some septal thickening in both lung apices. Calcified aortic atherosclerosis. Left chest cardiac pacemaker. See also thoracic spine CT. IMPRESSION: 1. No acute traumatic injury identified in the Cervical Spine. 2. Abnormal pulmonary ground-glass opacity and septal thickening in both lung apices. See Thoracic Spine CT reported separately. 3.  Aortic Atherosclerosis (ICD10-I70.0). Electronically Signed   By: Genevie Ann M.D.   On: 10/14/2022 08:23   CT HEAD WO CONTRAST (5MM)  Result Date: 10/14/2022 CLINICAL DATA:  88 year old female status post fall yesterday 1600 hours. Laceration to nose, contusion to forehead. EXAM: CT HEAD WITHOUT CONTRAST TECHNIQUE: Contiguous axial images were obtained from the base of the skull through the vertex without intravenous contrast. RADIATION DOSE REDUCTION: This exam was performed according to the departmental dose-optimization program which includes automated exposure control, adjustment of the mA and/or kV according to patient size and/or use of iterative reconstruction technique. COMPARISON:  Brain MRI  08/06/2017.  Head CT 02/23/2020. FINDINGS: Brain: Cerebral volume not significantly changed since 2021. No midline shift, ventriculomegaly, mass effect, evidence of mass lesion, intracranial hemorrhage or evidence of cortically based acute infarction. Chronic posterior right  corona radiata lacunar infarct tracking to the posterior lentiform is stable, along with patchy additional bilateral periventricular white matter hypodensity. Vascular: Calcified atherosclerosis at the skull base. No suspicious intracranial vascular hyperdensity. Skull: Chronic left mastoidectomy. Acute appearing left nasal bone fractures are mildly displaced, along with fractured left nasal process of the maxilla on series 4, image 15. Difficult to exclude nondisplaced fracture of the left maxillary sinus anterior wall. See coronal image 16. Calvarium appears intact. No other No acute osseous abnormality identified. Sinuses/Orbits: Small low to intermediate density fluid level layering in the left maxillary sinus. And similar low to intermediate density bubbly opacity at the frontoethmoidal recesses, frontal sinuses. Chronic left mastoidectomy is aerated. Other mastoids, tympanic cavities and paranasal sinuses are clear. Other: Mild left forehead scalp contusion on series 4, image 46. Underlying frontal bone intact. No scalp soft tissue gas. Orbits soft tissues appears stable and negative. IMPRESSION: 1. Mildly displaced left nasal bones and left nasal process of the maxilla fractures. And difficult to exclude nondisplaced fracture of the left maxillary sinus anterior wall, with small volume fluid in the left maxillary and frontal sinuses which could in part be blood products. 2. Mild left forehead scalp contusion. No underlying skull fracture. 3. No acute intracranial abnormality. Chronic small vessel disease. Electronically Signed   By: Genevie Ann M.D.   On: 10/14/2022 08:14    EKG: Independently reviewed.  Atrial paced with rate 60; LBBB with no evidence of acute ischemia   Labs on Admission: I have personally reviewed the available labs and imaging studies at the time of the admission.  Pertinent labs:    Na++ 129; 134 on 10/6 Albumin 3.3 WBC 12.7 COVID POSITIVE   Assessment and Plan: Principal  Problem:   COVID-19 virus infection Active Problems:   Chronic systolic (congestive) heart failure (HCC)   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   Depression and anxiety   Complete AV block s/p PPM    Hypotension   Back pain   COPD (chronic obstructive pulmonary disease) ([place])   DNR (do not resuscitate)    Acute respiratory failure with hypoxia due to COVID-19 PNA -Patient with COPD on nocturnal O2 only presenting with SOB and hypoxia to 80s in the setting of known COVID infection -She does not have a usual home daytime O2 requirement and is currently requiring 2-3L [place] O2 -COVID POSITIVE -The patient has comorbidities which may increase the risk for ARDS/MODS including: age, HTN, COPD, CHF -Her primary complaints are weakness and chest/back pain -Will check COVID labs -CXR with multifocal opacities which may be c/w COVID  -Will not treat with broad-spectrum antibiotics if procalcitonin <0.5 -Will admit for further evaluation, close monitoring, and treatment -At this time, will attempt to avoid use of aerosolized medications and use HFAs instead -Will order steroids but she is out of the window for other COVID treatments at this time (her soon reports that she took Paxlovid and just completed treatment) -PT/OT consults -Encourage mobilization/ambulation as much as possible  Hypotension -Continue midodrine  H/o CVA -Continue ASA -Reported mild residual left-sided weakness -She appears to no longer be taking statin therapy  Chronic systolic CHF -Admitted in 07/2022 with EF 30-35%, non-ischemic  Anxiety/depression -Continue sertraline, [place] -Will order delirium precautions  Back pain -  She has chronic compression fractures in addition to her acute issues, all of which may be contributing to her weakness -Will order lidocaine patch for her back in addition to PO oxy prn and Robaxin -PT/OT consults  Pacemaker -Presence noted  COPD -Wears  1L nocturnal O2 -Very remote smoking history -Continue albuterol  DNR -I have discussed code status with the patient and she would not desire resuscitation and would prefer to die a natural death should that situation arise. -She will need a gold out of facility DNR form at the time of discharge     Advance Care Planning:   Code Status: DNR   Consults: RT; PT/OT  DVT Prophylaxis: Lovenox  Family Communication: None present; I spoke with her son by telephone at the time of admission  Severity of Illness: The appropriate patient status for this patient is INPATIENT. Inpatient status is judged to be reasonable and necessary in order to provide the required intensity of service to ensure the patient's safety. The patient's presenting symptoms, physical exam findings, and initial radiographic and laboratory data in the context of their chronic comorbidities is felt to place them at high risk for further clinical deterioration. Furthermore, it is not anticipated that the patient will be medically stable for discharge from the hospital within 2 midnights of admission.   * I certify that at the point of admission it is my clinical judgment that the patient will require inpatient hospital care spanning beyond 2 midnights from the point of admission due to high intensity of service, high risk for further deterioration and high frequency of surveillance required.*  Author: Karmen Bongo, MD 10/14/2022 5:39 PM  For on call review [email or web address].

## 2022-10-14 NOTE — ED Provider Notes (Signed)
[facility] EMERGENCY DEPARTMENT Provider Note   CSN: [id number] Arrival date & time: 10/14/22  0546     History Chief Complaint  Patient presents with   SOB / Fall last week     HPI Bethany Nichols is a 88 y.o. female presenting for shortness of breath, frequent falls, failure to thrive.  She was diagnosed with COVID last Wednesday and has had progressive dyspnea fatigue and has fell and twice at her daytime nursing facility.  She goes to a nursing facility 2 days a week for medical evaluation, physical therapy and adult daycare.  She has fallen at her facility this week and has been noted to have fevers and cough.  Patient tested COVID-positive on Wednesday.  Family had to turn her oxygen up from her baseline 1 L to 3 L.  They endorse worsening dyspnea and fatigue. She is otherwise ambulatory tolerating p.o. intake.   Patient's recorded medical, surgical, social, medication list and allergies were reviewed in the Snapshot window as part of the initial history.   Review of Systems   Review of Systems  Constitutional:  Positive for fatigue. Negative for chills and fever.  HENT:  Negative for ear pain and sore throat.   Eyes:  Negative for pain and visual disturbance.  Respiratory:  Positive for shortness of breath. Negative for cough.   Cardiovascular:  Negative for chest pain and palpitations.  Gastrointestinal:  Negative for abdominal pain and vomiting.  Genitourinary:  Negative for dysuria and hematuria.  Musculoskeletal:  Negative for arthralgias and back pain.  Skin:  Negative for color change and rash.  Neurological:  Positive for weakness. Negative for seizures and syncope.  All other systems reviewed and are negative.   Physical Exam Updated Vital Signs BP 117/67   Pulse (!) 59   Temp 97.8 F (36.6 C)   Resp 14   SpO2 90%  Physical Exam Vitals and nursing note reviewed.  Constitutional:      General: She is not in acute distress.     Appearance: She is well-developed.  HENT:     Head: Normocephalic and atraumatic.  Eyes:     Conjunctiva/sclera: Conjunctivae normal.  Cardiovascular:     Rate and Rhythm: Normal rate and regular rhythm.     Heart sounds: No murmur heard. Pulmonary:     Effort: Pulmonary effort is normal. No respiratory distress.     Breath sounds: Normal breath sounds.  Abdominal:     General: There is no distension.     Palpations: Abdomen is soft.     Tenderness: There is no abdominal tenderness. There is no right CVA tenderness or left CVA tenderness.  Musculoskeletal:        General: No swelling or tenderness. Normal range of motion.     Cervical back: Neck supple.  Skin:    General: Skin is warm and dry.  Neurological:     General: No focal deficit present.     Mental Status: She is alert and oriented to person, place, and time. Mental status is at baseline.     Cranial Nerves: No cranial nerve deficit.      ED Course/ Medical Decision Making/ A&P    Procedures Procedures   Medications Ordered in ED Medications - No data to display  Medical Decision Making:    [name] is a 88 y.o. female who presented to the ED today with fatigue and AMS detailed above.     Patient's presentation is complicated  by their history of multiple comorbid problems.  Patient placed on continuous vitals and telemetry monitoring while in ED which was reviewed periodically.   Complete initial physical exam performed, notably the patient  was HDS in NAD.      Reviewed and confirmed nursing documentation for past medical history, family history, social history.    Initial Assessment:   With the patient's presentation of falls, shortness of breath, failure to thrive, most likely diagnosis is delirium secondary to her active COVID infection. Other diagnoses were considered including (but not limited to) underlying infections including UTI or CAP. These are considered less likely due to history of  present illness and physical exam findings.   This is most consistent with an acute life/limb threatening illness complicated by underlying chronic conditions.  Initial Plan:  Traumatic trauma series CTs were ordered by triage and grossly no acute pathology appreciated. Given patient shortness of breath, will add on CTA PE study Screening labs including CBC and Metabolic panel to evaluate for infectious or metabolic etiology of disease.  Urinalysis with reflex culture ordered to evaluate for UTI or relevant urologic/nephrologic pathology.  CXR to evaluate for structural/infectious intrathoracic pathology.  EKG to evaluate for cardiac pathology. Objective evaluation as below reviewed with plan for close reassessment  Initial Study Results:   Laboratory  All laboratory results reviewed without evidence of clinically relevant pathology.    EKG EKG was reviewed independently. Rate, rhythm, axis, intervals all examined and without medically relevant abnormality. ST segments without concerns for elevations.    Radiology  All images reviewed independently. Agree with radiology report at this time.   DG Shoulder Left  Result Date: 10/14/2022 CLINICAL DATA:  88 year old female status post fall yesterday 1600 hours. Pain. EXAM: LEFT SHOULDER - 2+ VIEW COMPARISON:  Chest radiographs today. Left shoulder series 06/13/2015. FINDINGS: No glenohumeral joint dislocation. Joint spaces and alignment appear normal for age. No fracture of the left clavicle, scapula, or proximal humerus identified. Left chest cardiac pacemaker. Left lung interstitial opacity, see Chest series today reported separately. IMPRESSION: No acute fracture or dislocation identified about the left shoulder. Electronically Signed   By: Genevie Ann M.D.   On: 10/14/2022 08:48   DG Shoulder Right  Result Date: 10/14/2022 CLINICAL DATA:  88 year old female status post fall yesterday 1600 hours. Pain. EXAM: RIGHT SHOULDER - 2+ VIEW  COMPARISON:  Right shoulder series 06/13/2015. Chest radiographs today. FINDINGS: No glenohumeral joint dislocation. Osteopenia. No fracture of the visible right clavicle, scapula, or proximal right humerus. Joint spaces and alignment appear normal for age. Stable visible right chest as described on the dedicated chest series today. IMPRESSION: No acute fracture or dislocation identified about the right shoulder. Electronically Signed   By: Genevie Ann M.D.   On: 10/14/2022 08:47   DG Chest 2 View  Result Date: 10/14/2022 CLINICAL DATA:  88 year old female status post fall yesterday 1600 hours. Pain. EXAM: CHEST - 2 VIEW COMPARISON:  Thoracic spine CT today.  Portable chest 07/30/2022. FINDINGS: AP and lateral views at 0751 hours. There is cardiomegaly. Other mediastinal contours are within normal limits. Chronic left chest pacemaker. Fairly diffuse increased bilateral pulmonary interstitial opacity compared to October. No pneumothorax. No pleural effusion is evident. No consolidation. Thoracic spine detailed separately.  Negative visible bowel gas. IMPRESSION: 1. Fairly diffuse increased bilateral pulmonary interstitial opacity as described on thoracic spine CT today. Underlying cardiomegaly favors pulmonary edema but viral/atypical respiratory infection not excluded. 2. Thoracic spine detailed separately on CT. Electronically Signed  By: Genevie Ann M.D.   On: 10/14/2022 08:46   DG Hip Unilat W or Wo Pelvis 2-3 Views Left  Result Date: 10/14/2022 CLINICAL DATA:  88 year old female status post fall yesterday 1600 hours. Pain. EXAM: DG HIP (WITH OR WITHOUT PELVIS) 2-3V LEFT COMPARISON:  CT Abdomen and Pelvis 05/18/2015. Lumbar spine CT today. FINDINGS: Bone mineralization is within normal limits for age. Femoral heads are normally located. Pelvis appears intact with some symphysis degeneration. Grossly intact proximal right femur. Dedicated left hip AP and lateral views. Proximal left femur appears intact.  Retained stool but otherwise negative lower abdominal and pelvic visceral contours. IMPRESSION: No acute fracture or dislocation identified about the left hip or pelvis. Electronically Signed   By: Genevie Ann M.D.   On: 10/14/2022 08:44   CT Lumbar Spine Wo Contrast  Result Date: 10/14/2022 CLINICAL DATA:  88 year old female status post fall yesterday 1600 hours. Laceration to nose, contusion to forehead. EXAM: CT LUMBAR SPINE WITHOUT CONTRAST TECHNIQUE: Multidetector CT imaging of the lumbar spine was performed without intravenous contrast administration. Multiplanar CT image reconstructions were also generated. RADIATION DOSE REDUCTION: This exam was performed according to the departmental dose-optimization program which includes automated exposure control, adjustment of the mA and/or kV according to patient size and/or use of iterative reconstruction technique. COMPARISON:  CT thoracic spine today. [facility] CT Abdomen and Pelvis 05/18/2015. FINDINGS: Segmentation: Normal, concordant with the thoracic numbering today. Alignment: Relatively normal lumbar lordosis. Subtle anterolisthesis of L4 on L5. Vertebrae: No prior sagittal images available with the 2016 CT Abdomen and Pelvis. Osteopenia. Lumbar vertebrae appear intact. Visible sacrum and SI joints appear intact. No acute osseous abnormality identified. Paraspinal and other soft tissues: Aortoiliac calcified atherosclerosis. Retained large bowel stool. Negative other visible noncontrast abdominal viscera. Lumbar paraspinal soft tissues are within normal limits. Disc levels: Mild for age lumbar spine degeneration, primarily facet arthropathy. Comparatively mild lumbar disc bulging. No CT evidence of lumbar spinal stenosis. IMPRESSION: 1. No acute traumatic injury identified in the Lumbar Spine. 2. Mild for age lumbar spine degeneration. 3.  Aortic Atherosclerosis (ICD10-I70.0). Electronically Signed   By: Genevie Ann M.D.   On: 10/14/2022 08:32   CT  Thoracic Spine Wo Contrast  Result Date: 10/14/2022 CLINICAL DATA:  88 year old female status post fall yesterday 1600 hours. Laceration to nose, contusion to forehead. EXAM: CT THORACIC SPINE WITHOUT CONTRAST TECHNIQUE: Multidetector CT images of the thoracic were obtained using the standard protocol without intravenous contrast. RADIATION DOSE REDUCTION: This exam was performed according to the departmental dose-optimization program which includes automated exposure control, adjustment of the mA and/or kV according to patient size and/or use of iterative reconstruction technique. COMPARISON:  Cervical spine CT today.  Chest CTA 05/16/2017. FINDINGS: Limited cervical spine imaging:  Detailed separately. Thoracic spine segmentation:  Appears to be normal. Alignment: Chronic but progressed exaggerated thoracic kyphosis since 2018. No significant spondylolisthesis or scoliosis. Vertebrae: Diffuse chronic osteopenia. Mild chronic T1, T2, and T4 compression fractures. Moderate to severe chronic T7 compression fracture, only mild loss of height there since 2018. No superimposed acute osseous abnormality identified in the thoracic spine. Intermittent chronic posterior rib fractures. No visible acute rib fracture identified. Paraspinal and other soft tissues: Left chest pacemaker. Extensive Calcified aortic atherosclerosis. Chronic cardiomegaly appears progressed since 2018. No pericardial effusion. Small, trace layering left pleural effusion is new. Respiratory motion and atelectatic changes to the major airways which remain patent. Asymmetric patchy and widespread bilateral pulmonary ground-glass opacity with some septal thickening. No  consolidation. Negative visible noncontrast upper abdominal viscera. Thoracic paraspinal soft tissues appear within normal limits. Disc levels: Mild for age thoracic spine degeneration and no CT evidence of thoracic spinal stenosis despite some chronic compression fractures as stated  above. IMPRESSION: 1. No acute traumatic injury identified in the Thoracic Spine. Osteopenia with chronic T1, T2, T4, T7 compression fractures. Chronic posterior rib fractures. 2. Chronic but increased cardiomegaly since 2018, with trace layering left pleural effusion, and widespread pulmonary ground-glass opacity with some septal thickening. This is indeterminate for pulmonary edema versus viral/atypical respiratory infection (including COVID-19). 3.  Aortic Atherosclerosis (ICD10-I70.0). Electronically Signed   By: Genevie Ann M.D.   On: 10/14/2022 08:29   CT Cervical Spine Wo Contrast  Result Date: 10/14/2022 CLINICAL DATA:  88 year old female status post fall yesterday 1600 hours. Laceration to nose, contusion to forehead. EXAM: CT CERVICAL SPINE WITHOUT CONTRAST TECHNIQUE: Multidetector CT imaging of the cervical spine was performed without intravenous contrast. Multiplanar CT image reconstructions were also generated. RADIATION DOSE REDUCTION: This exam was performed according to the departmental dose-optimization program which includes automated exposure control, adjustment of the mA and/or kV according to patient size and/or use of iterative reconstruction technique. COMPARISON:  Cervical spine CT 06/13/2015. FINDINGS: Alignment: Stable cervical lordosis since 2016. Cervicothoracic junction alignment is within normal limits. Bilateral posterior element alignment is within normal limits. Skull base and vertebrae: Visualized skull base is intact. No atlanto-occipital dissociation. C1 and C2 appear intact and aligned. No acute osseous abnormality identified. Soft tissues and spinal canal: No prevertebral fluid or swelling. No visible canal hematoma. Right carotid calcified atherosclerosis but otherwise negative visible noncontrast neck soft tissues. Disc levels: Mild for age cervical spine degeneration appears stable since 2016, with fairly capacious spinal canal. Upper chest: Thoracic spine is detailed  separately today, but chronic T1, T2, and T3 superior endplate compression appears stable since 2016. Patchy and confluent, somewhat geometric areas of abnormal pulmonary ground-glass opacity with some septal thickening in both lung apices. Calcified aortic atherosclerosis. Left chest cardiac pacemaker. See also thoracic spine CT. IMPRESSION: 1. No acute traumatic injury identified in the Cervical Spine. 2. Abnormal pulmonary ground-glass opacity and septal thickening in both lung apices. See Thoracic Spine CT reported separately. 3.  Aortic Atherosclerosis (ICD10-I70.0). Electronically Signed   By: Genevie Ann M.D.   On: 10/14/2022 08:23   CT HEAD WO CONTRAST (5MM)  Result Date: 10/14/2022 CLINICAL DATA:  88 year old female status post fall yesterday 1600 hours. Laceration to nose, contusion to forehead. EXAM: CT HEAD WITHOUT CONTRAST TECHNIQUE: Contiguous axial images were obtained from the base of the skull through the vertex without intravenous contrast. RADIATION DOSE REDUCTION: This exam was performed according to the departmental dose-optimization program which includes automated exposure control, adjustment of the mA and/or kV according to patient size and/or use of iterative reconstruction technique. COMPARISON:  Brain MRI 08/06/2017.  Head CT 02/23/2020. FINDINGS: Brain: Cerebral volume not significantly changed since 2021. No midline shift, ventriculomegaly, mass effect, evidence of mass lesion, intracranial hemorrhage or evidence of cortically based acute infarction. 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 Vascular: Calcified atherosclerosis at the skull base. No suspicious intracranial vascular hyperdensity. Skull: Chronic left mastoidectomy. Acute appearing left nasal bone fractures are mildly displaced, along with fractured left nasal process of the maxilla on  series 4, image 15. Difficult to exclude nondisplaced fracture of the left maxillary sinus anterior wall. See coronal  image 16. Calvarium appears intact. No other No acute osseous abnormality identified. Sinuses/Orbits: Small low to intermediate density fluid level layering in the left maxillary sinus. And similar low to intermediate density bubbly opacity at the frontoethmoidal recesses, frontal sinuses. Chronic left mastoidectomy is aerated. Other mastoids, tympanic cavities and paranasal sinuses are clear. Other: Mild left forehead scalp contusion on series 4, image 46. Underlying frontal bone intact. No scalp soft tissue gas. Orbits soft tissues appears stable and negative. IMPRESSION: 1. Mildly displaced left nasal bones and left nasal process of the maxilla fractures. And difficult to exclude nondisplaced fracture of the left maxillary sinus anterior wall, with small volume fluid in the left maxillary and frontal sinuses which could in part be blood products. 2. Mild left forehead scalp contusion. No underlying skull fracture. 3. No acute intracranial abnormality. Chronic small vessel disease. Electronically Signed   By: Genevie Ann M.D.   On: 10/14/2022 08:14     Consults:  Case discussed with hospitalist.   Final Assessment and Plan:   Patient shortness of breath, altered mental status, fatigue, frequent falls are all concerning for progression of her known COVID infection. Given her increasing oxygen requirement, she is going to require increased inpatient care and management.  Patient [place] admission with no further acute events.   Disposition:   Based on the above findings, I believe this patient is stable for admission.    Patient/family educated about specific findings on our evaluation and explained exact reasons for admission.  Patient/family educated about clinical situation and time was allowed to answer questions.   Admission team communicated with and agreed with need for admission.  Patient admitted. Patient  ready to move at this time.     Emergency Department Medication Summary:   Medications  aspirin EC tablet 81 mg (81 mg Oral Given 10/15/22 1103)  midodrine (PROAMATINE) tablet 2.5 mg (2.5 mg Oral Not Given 10/15/22 1142)  memantine (NAMENDA) tablet 5 mg (5 mg Oral Given 10/15/22 1102)  sertraline (ZOLOFT) tablet 150 mg (150 mg Oral Given 10/14/22 2324)  albuterol (VENTOLIN HFA) 108 (90 Base) MCG/ACT inhaler 2 puff (has no administration in time range)  sodium chloride flush (NS) 0.9 % injection 3 mL (3 mLs Intravenous Given 10/15/22 1213)  sodium chloride flush (NS) 0.9 % injection 3 mL (has no administration in time range)  0.9 %  sodium chloride infusion (has no administration in time range)  methylPREDNISolone sodium succinate (SOLU-MEDROL) 40 mg/mL injection 30 mg (30 mg Intravenous Given 10/15/22 1103)    Followed by  predniSONE (DELTASONE) tablet 50 mg (has no administration in time range)  guaiFENesin-dextromethorphan (ROBITUSSIN DM) 100-10 MG/5ML syrup 10 mL (has no administration in time range)  chlorpheniramine-HYDROcodone (TUSSIONEX) 10-8 MG/5ML suspension 5 mL (has no administration in time range)  ascorbic acid (VITAMIN C) tablet 500 mg (500 mg Oral Given 10/15/22 1102)  zinc sulfate capsule 220 mg (220 mg Oral Given 10/15/22 1102)  acetaminophen (TYLENOL) tablet 650 mg (650 mg Oral Given 10/14/22 2110)  oxyCODONE (Oxy IR/ROXICODONE) immediate release tablet 5 mg (has no administration in time range)  docusate sodium (COLACE) capsule 100 mg (100 mg Oral Given 10/15/22 1102)  polyethylene glycol (MIRALAX / GLYCOLAX) packet 17 g (has no administration in time range)  bisacodyl (DULCOLAX) EC tablet 5 mg (has no administration in time range)  sodium phosphate (FLEET) 7-19 GM/118ML enema 1 enema (has no administration in time range)  ondansetron (ZOFRAN) tablet 4 mg (has no administration in time range)  Or  ondansetron (ZOFRAN) injection 4 mg (has no  administration in time range)  methocarbamol (ROBAXIN) 500 mg in dextrose 5 % 50 mL IVPB (has no administration in time range)  lidocaine (LIDODERM) 5 % 3 patch (3 patches Transdermal Patch Removed 10/15/22 1214)  enoxaparin (LOVENOX) injection 40 mg (40 mg Subcutaneous Given 10/14/22 2107)  acetaminophen (TYLENOL) tablet 1,000 mg (1,000 mg Oral Given 10/14/22 1306)  dexamethasone (DECADRON) injection 6 mg (6 mg Intravenous Given 10/14/22 1605)  iohexol (OMNIPAQUE) 350 MG/ML injection 75 mL (75 mLs Intravenous Contrast Given 10/15/22 0239)         Clinical Impression: No diagnosis found.   Data Unavailable   Final Clinical Impression(s) / ED Diagnoses Final diagnoses:  None    Rx / DC Orders ED Discharge Orders     None         Tretha Sciara, MD 10/15/22 1506

## 2022-10-14 NOTE — ED Notes (Signed)
Pt stated she felt as if she was choking.  Sats of 91% on 2L.  O2 increased to 3L per MD noted (O2 sats improved to 94%).  Airway patent.  Some saliva suctioned.  Pt able to swallow water and pills and stated sensation improved.

## 2022-10-14 NOTE — ED Notes (Signed)
Called 2 W for a purple man

## 2022-10-14 NOTE — ED Notes (Signed)
Rechecking pt vital signs and pt o2 sats were 88-89% with great pleth placed pt on 2L of o2 [place].

## 2022-10-14 NOTE — ED Provider Triage Note (Signed)
Emergency Medicine Provider Triage Evaluation Note  Bethany Nichols , a 88 y.o. female  was evaluated in triage.  Pt complains of fall yesterday 4pm where she tripped and fell forwards and landed on the wooden floor causing face trauma (lac to nose and contusion to forehead). No LOC and she states no anticoag.   Per EMS report the fall happened last week.  Per patient this fall happened yesterday.  Patient has a history of [place] issues    Review of Systems  Positive: Fall Negative: Fever  Physical Exam  BP 129/68   Pulse 61   Temp 98.4 F (36.9 C)   Resp 18   SpO2 93%  Gen:   Awake, no distress   Resp:  Normal effort  MSK:   Moves extremities without difficulty  Other:  Contusion to head, TTP of both shoulders (shoulders appear located) diffuse TTP of c/t/l spine but there is some focal mid T spine TTP. No deformity. Sensation and strength reasonable in BL LE.   Medical Decision Making  Medically screening exam initiated at 6:21 AM.  Appropriate orders placed.  Bethany Nichols was informed that the remainder of the evaluation will be completed by another provider, this initial triage assessment does not replace that evaluation, and the importance of remaining in the ED until their evaluation is complete.  Workup initiated.    Bethany Nichols, [place] 10/14/22 [phone]

## 2022-10-14 NOTE — ED Notes (Signed)
Contacted lab, the covid swab is in progress

## 2022-10-15 ENCOUNTER — Inpatient Hospital Stay (HOSPITAL_COMMUNITY): Payer: No Typology Code available for payment source

## 2022-10-15 DIAGNOSIS — U071 COVID-19: Secondary | ICD-10-CM | POA: Diagnosis not present

## 2022-10-15 LAB — COMPREHENSIVE METABOLIC PANEL
ALT: 18 U/L (ref 0–44)
AST: 21 U/L (ref 15–41)
Albumin: 3 g/dL — ABNORMAL LOW (ref 3.5–5.0)
Alkaline Phosphatase: 58 U/L (ref 38–126)
Anion gap: 10 (ref 5–15)
BUN: 20 mg/dL (ref 8–23)
CO2: 22 mmol/L (ref 22–32)
Calcium: 8.4 mg/dL — ABNORMAL LOW (ref 8.9–10.3)
Chloride: 99 mmol/L (ref 98–111)
Creatinine, Ser: 0.52 mg/dL (ref 0.44–1.00)
GFR, Estimated: >60 mL/min (ref >60)
Glucose, Bld: 132 mg/dL — ABNORMAL HIGH (ref 70–99)
Potassium: 4 mmol/L (ref 3.5–5.1)
Sodium: 131 mmol/L — ABNORMAL LOW (ref 135–145)
Total Bilirubin: 0.8 mg/dL (ref 0.3–1.2)
Total Protein: 6.4 g/dL — ABNORMAL LOW (ref 6.5–8.1)

## 2022-10-15 LAB — CBC WITH DIFFERENTIAL/PLATELET
Abs Immature Granulocytes: 0.19 10*3/uL — ABNORMAL HIGH (ref 0.00–0.07)
Basophils Absolute: 0 10*3/uL (ref 0.0–0.1)
Basophils Relative: 0 %
Eosinophils Absolute: 0 10*3/uL (ref 0.0–0.5)
Eosinophils Relative: 0 %
HCT: 39.7 % (ref 36.0–46.0)
Hemoglobin: 13.4 g/dL (ref 12.0–15.0)
Immature Granulocytes: 2 %
Lymphocytes Relative: 8 %
Lymphs Abs: 0.8 10*3/uL (ref 0.7–4.0)
MCH: 30.1 pg (ref 26.0–34.0)
MCHC: 33.8 g/dL (ref 30.0–36.0)
MCV: 89.2 fL (ref 80.0–100.0)
Monocytes Absolute: 0.4 10*3/uL (ref 0.1–1.0)
Monocytes Relative: 4 %
Neutro Abs: 8.4 10*3/uL — ABNORMAL HIGH (ref 1.7–7.7)
Neutrophils Relative %: 86 %
Platelets: 310 10*3/uL (ref 150–400)
RBC: 4.45 MIL/uL (ref 3.87–5.11)
RDW: 14.6 % (ref 11.5–15.5)
WBC: 9.8 10*3/uL (ref 4.0–10.5)
nRBC: 0 % (ref 0.0–0.2)

## 2022-10-15 MED ORDER — IOHEXOL 350 MG/ML SOLN
75.0000 mL | Freq: Once | INTRAVENOUS | Status: AC | PRN
  Administered 2022-10-15: 75 mL via INTRAVENOUS

## 2022-10-15 NOTE — Progress Notes (Signed)
PROGRESS NOTE    Bethany Nichols  QZE:[id number] DOB: [birth year] DOA: 10/14/2022 PCP: [facility]   Brief Narrative:  This 88 yrs old female with PMH significant for PPM placement, CVA, HTN, HLD, Anxiety/ Depression presented  s/p Fall yesterday,  She has developed SOB today.  She reports that she has developed COVID symptoms about 9 days ago and was tested positive on 12/9.  She usually wears supplemental oxygen 1 L/min at bedtime but has been short of breath and has needed continuous oxygen at 2 L/min.  Patient also reports generalized weakness and recurrent falls.  She hurt her nose in one of the falls and has developed diffuse chest pain,  back pain and is quite reproducible.  CTA ruled out pulmonary embolism but shows multifocal pneumonia consistent with COVID.  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 to COVID-19 PNA: Patient with COPD on nocturnal oxygen presented with SOB and hypoxia SpO2 80% on room air. COVID test+ on 10/06/2022. Patient has comorbidities which may increase the risk of ARDS including age, HTN, COPD, CHF. Patient reports generalized weakness and chest and back pain.   Chest x-ray shows multifocal opacities consistent with COVID. Hold on antibiotics since procalcitonin less than 0.5.   CTA ruled out pulmonary embolism but shows multifocal pneumonia consistent with COVID Continue steroids and she has completed Paxlovid treatment. PT and OT eval. Encourage mobilization and ambulation is much as possible  Hypotension: Continue midodrine  History of CVA: Continue aspirin.   Patient  has mild residual left-sided weakness. She is not on a statin.  Chronic systolic CHF: Appears euvolemic.   Last echo shows LVEF 30 to 35%, nonischemic  Anxiety/depression: Continue Zoloft, Namenda. Continue delirium precautions  Back pain: Patient has chronic compression fractures in addition to her acute issues which may be contributing to her weakness. Continue lidocaine patch for her back in addition to pain meds. PT and OT eval  S/p pacemaker: No issues. F/u Outpatient  COPD: Continue supplemental oxygen.   Continue home inhalers.   DVT prophylaxis: Lovenox Code Status: Full code Family Communication: No family at bed side. Disposition Plan:    Status is: Inpatient Remains inpatient appropriate because: Admitted for acute on chronic hypoxic respiratory failure secondary to COVID-pneumonia.  Patient is improving.  PT and OT eval is pending.   Consultants:  None  Procedures: CTA chest  Antimicrobials:  Anti-infectives (From admission, onward)    None       Subjective: Patient was seen and examined at bedside.  Overnight events noted.   Patient reports doing much better.  She remains on 2.5 L of supplemental oxygen.   Patient has a left forehead laceration from fall.  Objective: Vitals:   10/14/22 2115 10/14/22 2136 10/15/22 0312 10/15/22 0834  BP: 134/71 126/71 134/85 135/76  Pulse: 63 66 60 71  Resp: 16 (!) '24 17 18  '$ Temp:  97.7 F (36.5 C) (!) 97.5 F (36.4 C) 97.7 F (36.5 C)  TempSrc:  Oral Oral Oral  SpO2: 94% 97% 96% 95%  Weight:        Intake/Output Summary (Last 24  hours) at 10/15/2022 1126 Last data filed at 10/15/2022 0600 Gross per 24 hour  Intake --  Output 400 ml  Net -400 ml   Filed Weights   10/14/22 1800  Weight: 60 kg    Examination:  General exam: Appears comfortable, not in any distress, forehead laceration noted. Respiratory system: CTA bilaterally, respiratory effort normal Cardiovascular system: S1 & S2 heard,  regular rate and rhythm, no murmur.  PPM noted Gastrointestinal system: Abdomen is soft, non tender, non distended, BS+ Central nervous system: Alert and oriented x 2. No focal neurological deficits. Extremities: No edema, no cyanosis, no clubbing. Skin: No rashes, lesions or ulcers Psychiatry: Mood & affect appropriate.     Data Reviewed: I have personally reviewed following labs and imaging studies  CBC: Recent Labs  Lab 10/14/22 0635 10/15/22 0318  WBC 12.7* 9.8  NEUTROABS  --  8.4*  HGB 12.6 13.4  HCT 37.6 39.7  MCV 88.7 89.2  PLT 318 357   Basic Metabolic Panel: Recent Labs  Lab 10/14/22 0635 10/15/22 0318  NA 129* 131*  K 3.6 4.0  CL 94* 99  CO2 25 22  GLUCOSE 104* 132*  BUN 24* 20  CREATININE 0.82 0.52  CALCIUM 8.9 8.4*   GFR: Estimated Creatinine Clearance: 41.2 mL/min (by C-G formula based on SCr of 0.52 mg/dL). Liver Function Tests: Recent Labs  Lab 10/14/22 0635 10/15/22 0318  AST 34 21  ALT 24 18  ALKPHOS 56 58  BILITOT 0.7 0.8  PROT 6.7 6.4*  ALBUMIN 3.3* 3.0*   No results for input(s): "LIPASE", "AMYLASE" in the last 168 hours. No results for input(s): "AMMONIA" in the last 168 hours. Coagulation Profile: No results for input(s): "INR", "PROTIME" in the last 168 hours. Cardiac Enzymes: No results for input(s): "CKTOTAL", "CKMB", "CKMBINDEX", "TROPONINI" in the last 168 hours. BNP (last 3 results) No results for input(s): "PROBNP" in the last 8760 hours. HbA1C: No results for input(s): "HGBA1C" in the last 72 hours. CBG: No results for input(s): "GLUCAP" in the last 168 hours. Lipid Profile: No results for input(s): "CHOL", "HDL", "LDLCALC", "TRIG", "CHOLHDL", "LDLDIRECT" in the last 72 hours. Thyroid Function Tests: No results for input(s): "TSH", "T4TOTAL", "FREET4", "T3FREE", "THYROIDAB" in the last 72 hours. Anemia Panel: Recent Labs    10/14/22 1725  FERRITIN 102   Sepsis Labs: Recent Labs  Lab 10/14/22 1725  PROCALCITON  <0.10    Recent Results (from the past 240 hour(s))  Resp panel by RT-PCR (RSV, Flu A&B, Covid) Anterior Nasal Swab     Status: Abnormal   Collection Time: 10/14/22  2:12 PM   Specimen: Anterior Nasal Swab  Result Value Ref Range Status   SARS Coronavirus 2 by RT PCR POSITIVE (A) NEGATIVE Final    Comment: (NOTE) SARS-CoV-2 target nucleic acids are DETECTED.  The SARS-CoV-2 RNA is generally detectable in upper respiratory specimens during the acute phase of infection. Positive results are indicative of the presence of the identified virus, but do not rule out bacterial infection or co-infection with other pathogens not detected by the test. Clinical correlation with patient history and other diagnostic information is necessary to determine patient infection status. The expected result is Negative.  Fact Sheet for Patients: EntrepreneurPulse.com.au  Fact Sheet for Healthcare Providers: IncredibleEmployment.be  This test is not yet approved or cleared by the [place] FDA and  has been authorized for detection and/or diagnosis of SARS-CoV-2 by FDA under an Emergency Use Authorization (EUA).  This EUA will remain in  effect (meaning this test can be used) for the duration of  the COVID-19 declaration under Section 564(b)(1) of the A ct, 21 U.S.C. section 360bbb-3(b)(1), unless the authorization is terminated or revoked sooner.     Influenza A by PCR NEGATIVE NEGATIVE Final   Influenza B by PCR NEGATIVE NEGATIVE Final    Comment: (NOTE) The Xpert Xpress SARS-CoV-2/FLU/RSV plus assay is intended as an aid in the diagnosis of influenza from Nasopharyngeal swab specimens and should not be used as a sole basis for treatment. Nasal washings and aspirates are unacceptable for Xpert Xpress SARS-CoV-2/FLU/RSV testing.  Fact Sheet for Patients: EntrepreneurPulse.com.au  Fact Sheet for Healthcare  Providers: IncredibleEmployment.be  This test is not yet approved or cleared by the [place] FDA and has been authorized for detection and/or diagnosis of SARS-CoV-2 by FDA under an Emergency Use Authorization (EUA). This EUA will remain in effect (meaning this test can be used) for the duration of the COVID-19 declaration under Section 564(b)(1) of the Act, 21 U.S.C. section 360bbb-3(b)(1), unless the authorization is terminated or revoked.     Resp Syncytial Virus by PCR NEGATIVE NEGATIVE Final    Comment: (NOTE) Fact Sheet for Patients: EntrepreneurPulse.com.au  Fact Sheet for Healthcare Providers: IncredibleEmployment.be  This test is not yet approved or cleared by the [place] FDA and has been authorized for detection and/or diagnosis of SARS-CoV-2 by FDA under an Emergency Use Authorization (EUA). This EUA will remain in effect (meaning this test can be used) for the duration of the COVID-19 declaration under Section 564(b)(1) of the Act, 21 U.S.C. section 360bbb-3(b)(1), unless the authorization is terminated or revoked.  Performed at [facility], [place] [address], [place], [place] [address]     Radiology Studies: CT Angio Chest PE W and/or Wo Contrast  Result Date: 10/15/2022 CLINICAL DATA:  Status post fall, chest pain, evaluate for PE EXAM: CT ANGIOGRAPHY CHEST WITH CONTRAST TECHNIQUE: Multidetector CT imaging of the chest was performed using the standard protocol during bolus administration of intravenous contrast. Multiplanar CT image reconstructions and MIPs were obtained to evaluate the vascular anatomy. RADIATION DOSE REDUCTION: This exam was performed according to the departmental dose-optimization program which includes automated exposure control, adjustment of the mA and/or kV according to patient size and/or use of iterative reconstruction technique. CONTRAST:  34m OMNIPAQUE IOHEXOL 350 MG/ML  SOLN COMPARISON:  Chest radiograph and thoracic spine CT dated 10/14/2022 FINDINGS: Cardiovascular: Satisfactory opacification the bilateral pulmonary arteries to the lobar level. No evidence of pulmonary embolism. Study is not tailored for evaluation of thoracic aorta. No evidence of thoracic aortic aneurysm. Atherosclerotic calcifications of the arch. Cardiomegaly.  No pericardial effusion.  Left subclavian pacemaker. Very mild coronary atherosclerosis of the LAD. Mediastinum/Nodes: Small mediastinal lymph nodes which do not meet pathologic CT size criteria. Visualized thyroid is unremarkable. Lungs/Pleura: Multifocal patchy/ground-glass opacities in the lungs bilaterally, suggesting multifocal pneumonia. Superimposed bilateral lower lobe scarring/atelectasis. No suspicious pulmonary nodules. No pleural effusion or pneumothorax. Upper Abdomen: Visualized upper abdomen is grossly unremarkable, noting vascular calcifications. Musculoskeletal: Old bilateral rib fracture deformities. Chronic thoracic compression fracture deformities, including a moderate to severe T7 fracture, unchanged from recent thoracic spine CT. Review of the MIP images confirms the above findings. IMPRESSION: No evidence of pulmonary embolism. Multifocal patchy/interstitial opacities, favoring multifocal pneumonia. Additional ancillary findings as above. Aortic Atherosclerosis (ICD10-I70.0). Electronically Signed   By: SJulian HyM.D.   On: 10/15/2022 03:13   DG Shoulder Left  Result Date: 10/14/2022 CLINICAL DATA:  88year old  female status post fall yesterday 1600 hours. Pain. EXAM: LEFT SHOULDER - 2+ VIEW COMPARISON:  Chest radiographs today. Left shoulder series 06/13/2015. FINDINGS: No glenohumeral joint dislocation. Joint spaces and alignment appear normal for age. No fracture of the left clavicle, scapula, or proximal humerus identified. Left chest cardiac pacemaker. Left lung interstitial opacity, see Chest series today  reported separately. IMPRESSION: No acute fracture or dislocation identified about the left shoulder. Electronically Signed   By: Genevie Ann M.D.   On: 10/14/2022 08:48   DG Shoulder Right  Result Date: 10/14/2022 CLINICAL DATA:  88 year old female status post fall yesterday 1600 hours. Pain. EXAM: RIGHT SHOULDER - 2+ VIEW COMPARISON:  Right shoulder series 06/13/2015. Chest radiographs today. FINDINGS: No glenohumeral joint dislocation. Osteopenia. No fracture of the visible right clavicle, scapula, or proximal right humerus. Joint spaces and alignment appear normal for age. Stable visible right chest as described on the dedicated chest series today. IMPRESSION: No acute fracture or dislocation identified about the right shoulder. Electronically Signed   By: Genevie Ann M.D.   On: 10/14/2022 08:47   DG Chest 2 View  Result Date: 10/14/2022 CLINICAL DATA:  88 year old female status post fall yesterday 1600 hours. Pain. EXAM: CHEST - 2 VIEW COMPARISON:  Thoracic spine CT today.  Portable chest 07/30/2022. FINDINGS: AP and lateral views at 0751 hours. There is cardiomegaly. Other mediastinal contours are within normal limits. Chronic left chest pacemaker. Fairly diffuse increased bilateral pulmonary interstitial opacity compared to October. No pneumothorax. No pleural effusion is evident. No consolidation. Thoracic spine detailed separately.  Negative visible bowel gas. IMPRESSION: 1. Fairly diffuse increased bilateral pulmonary interstitial opacity as described on thoracic spine CT today. Underlying cardiomegaly favors pulmonary edema but viral/atypical respiratory infection not excluded. 2. Thoracic spine detailed separately on CT. Electronically Signed   By: Genevie Ann M.D.   On: 10/14/2022 08:46   DG Hip Unilat W or Wo Pelvis 2-3 Views Left  Result Date: 10/14/2022 CLINICAL DATA:  88 year old female status post fall yesterday 1600 hours. Pain. EXAM: DG HIP (WITH OR WITHOUT PELVIS) 2-3V LEFT COMPARISON:  CT  Abdomen and Pelvis 05/18/2015. Lumbar spine CT today. FINDINGS: Bone mineralization is within normal limits for age. Femoral heads are normally located. Pelvis appears intact with some symphysis degeneration. Grossly intact proximal right femur. Dedicated left hip AP and lateral views. Proximal left femur appears intact. Retained stool but otherwise negative lower abdominal and pelvic visceral contours. IMPRESSION: No acute fracture or dislocation identified about the left hip or pelvis. Electronically Signed   By: Genevie Ann M.D.   On: 10/14/2022 08:44   CT Lumbar Spine Wo Contrast  Result Date: 10/14/2022 CLINICAL DATA:  87 year old female status post fall yesterday 1600 hours. Laceration to nose, contusion to forehead. EXAM: CT LUMBAR SPINE WITHOUT CONTRAST TECHNIQUE: Multidetector CT imaging of the lumbar spine was performed without intravenous contrast administration. Multiplanar CT image reconstructions were also generated. RADIATION DOSE REDUCTION: This exam was performed according to the departmental dose-optimization program which includes automated exposure control, adjustment of the mA and/or kV according to patient size and/or use of iterative reconstruction technique. COMPARISON:  CT thoracic spine today. [facility] CT Abdomen and Pelvis 05/18/2015. FINDINGS: Segmentation: Normal, concordant with the thoracic numbering today. Alignment: Relatively normal lumbar lordosis. Subtle anterolisthesis of L4 on L5. Vertebrae: No prior sagittal images available with the 2016 CT Abdomen and Pelvis. Osteopenia. Lumbar vertebrae appear intact. Visible sacrum and SI joints appear intact. No acute osseous abnormality identified. Paraspinal and  other soft tissues: Aortoiliac calcified atherosclerosis. Retained large bowel stool. Negative other visible noncontrast abdominal viscera. Lumbar paraspinal soft tissues are within normal limits. Disc levels: Mild for age lumbar spine degeneration, primarily facet  arthropathy. Comparatively mild lumbar disc bulging. No CT evidence of lumbar spinal stenosis. IMPRESSION: 1. No acute traumatic injury identified in the Lumbar Spine. 2. Mild for age lumbar spine degeneration. 3.  Aortic Atherosclerosis (ICD10-I70.0). Electronically Signed   By: Genevie Ann M.D.   On: 10/14/2022 08:32   CT Thoracic Spine Wo Contrast  Result Date: 10/14/2022 CLINICAL DATA:  88 year old female status post fall yesterday 1600 hours. Laceration to nose, contusion to forehead. EXAM: CT THORACIC SPINE WITHOUT CONTRAST TECHNIQUE: Multidetector CT images of the thoracic were obtained using the standard protocol without intravenous contrast. RADIATION DOSE REDUCTION: This exam was performed according to the departmental dose-optimization program which includes automated exposure control, adjustment of the mA and/or kV according to patient size and/or use of iterative reconstruction technique. COMPARISON:  Cervical spine CT today.  Chest CTA 05/16/2017. FINDINGS: Limited cervical spine imaging:  Detailed separately. Thoracic spine segmentation:  Appears to be normal. Alignment: Chronic but progressed exaggerated thoracic kyphosis since 2018. No significant spondylolisthesis or scoliosis. Vertebrae: Diffuse chronic osteopenia. Mild chronic T1, T2, and T4 compression fractures. Moderate to severe chronic T7 compression fracture, only mild loss of height there since 2018. No superimposed acute osseous abnormality identified in the thoracic spine. Intermittent chronic posterior rib fractures. No visible acute rib fracture identified. Paraspinal and other soft tissues: Left chest pacemaker. Extensive Calcified aortic atherosclerosis. Chronic cardiomegaly appears progressed since 2018. No pericardial effusion. Small, trace layering left pleural effusion is new. Respiratory motion and atelectatic changes to the major airways which remain patent. Asymmetric patchy and widespread bilateral pulmonary ground-glass  opacity with some septal thickening. No consolidation. Negative visible noncontrast upper abdominal viscera. Thoracic paraspinal soft tissues appear within normal limits. Disc levels: Mild for age thoracic spine degeneration and no CT evidence of thoracic spinal stenosis despite some chronic compression fractures as stated above. IMPRESSION: 1. No acute traumatic injury identified in the Thoracic Spine. Osteopenia with chronic T1, T2, T4, T7 compression fractures. Chronic posterior rib fractures. 2. Chronic but increased cardiomegaly since 2018, with trace layering left pleural effusion, and widespread pulmonary ground-glass opacity with some septal thickening. This is indeterminate for pulmonary edema versus viral/atypical respiratory infection (including COVID-19). 3.  Aortic Atherosclerosis (ICD10-I70.0). Electronically Signed   By: Genevie Ann M.D.   On: 10/14/2022 08:29   CT Cervical Spine Wo Contrast  Result Date: 10/14/2022 CLINICAL DATA:  88 year old female status post fall yesterday 1600 hours. Laceration to nose, contusion to forehead. EXAM: CT CERVICAL SPINE WITHOUT CONTRAST TECHNIQUE: Multidetector CT imaging of the cervical spine was performed without intravenous contrast. Multiplanar CT image reconstructions were also generated. RADIATION DOSE REDUCTION: This exam was performed according to the departmental dose-optimization program which includes automated exposure control, adjustment of the mA and/or kV according to patient size and/or use of iterative reconstruction technique. COMPARISON:  Cervical spine CT 06/13/2015. FINDINGS: Alignment: Stable cervical lordosis since 2016. Cervicothoracic junction alignment is within normal limits. Bilateral posterior element alignment is within normal limits. Skull base and vertebrae: Visualized skull base is intact. No atlanto-occipital dissociation. C1 and C2 appear intact and aligned. No acute osseous abnormality identified. Soft tissues and spinal canal: No  prevertebral fluid or swelling. No visible canal hematoma. Right carotid calcified atherosclerosis but otherwise negative visible noncontrast neck soft tissues. Disc levels: Mild for age  cervical spine degeneration appears stable since 2016, with fairly capacious spinal canal. Upper chest: Thoracic spine is detailed separately today, but chronic T1, T2, and T3 superior endplate compression appears stable since 2016. Patchy and confluent, somewhat geometric areas of abnormal pulmonary ground-glass opacity with some septal thickening in both lung apices. Calcified aortic atherosclerosis. Left chest cardiac pacemaker. See also thoracic spine CT. IMPRESSION: 1. No acute traumatic injury identified in the Cervical Spine. 2. Abnormal pulmonary ground-glass opacity and septal thickening in both lung apices. See Thoracic Spine CT reported separately. 3.  Aortic Atherosclerosis (ICD10-I70.0). Electronically Signed   By: Genevie Ann M.D.   On: 10/14/2022 08:23   CT HEAD WO CONTRAST (5MM)  Result Date: 10/14/2022 CLINICAL DATA:  88 year old female status post fall yesterday 1600 hours. Laceration to nose, contusion to forehead. EXAM: CT HEAD WITHOUT CONTRAST TECHNIQUE: Contiguous axial images were obtained from the base of the skull through the vertex without intravenous contrast. RADIATION DOSE REDUCTION: This exam was performed according to the departmental dose-optimization program which includes automated exposure control, adjustment of the mA and/or kV according to patient size and/or use of iterative reconstruction technique. COMPARISON:  Brain MRI 08/06/2017.  Head CT 02/23/2020. FINDINGS: Brain: Cerebral volume not significantly changed since 2021. No midline shift, ventriculomegaly, mass effect, evidence of mass lesion, intracranial hemorrhage or evidence of cortically based acute infarction. Chronic posterior right corona radiata lacunar infarct tracking to the posterior lentiform is stable, along with patchy  additional bilateral periventricular white matter hypodensity. Vascular: Calcified atherosclerosis at the skull base. No suspicious intracranial vascular hyperdensity. Skull: Chronic left mastoidectomy. Acute appearing left nasal bone fractures are mildly displaced, along with fractured left nasal process of the maxilla on series 4, image 15. Difficult to exclude nondisplaced fracture of the left maxillary sinus anterior wall. See coronal image 16. Calvarium appears intact. No other No acute osseous abnormality identified. Sinuses/Orbits: Small low to intermediate density fluid level layering in the left maxillary sinus. And similar low to intermediate density bubbly opacity at the frontoethmoidal recesses, frontal sinuses. Chronic left mastoidectomy is aerated. Other mastoids, tympanic cavities and paranasal sinuses are clear. Other: Mild left forehead scalp contusion on series 4, image 46. Underlying frontal bone intact. No scalp soft tissue gas. Orbits soft tissues appears stable and negative. IMPRESSION: 1. Mildly displaced left nasal bones and left nasal process of the maxilla fractures. And difficult to exclude nondisplaced fracture of the left maxillary sinus anterior wall, with small volume fluid in the left maxillary and frontal sinuses which could in part be blood products. 2. Mild left forehead scalp contusion. No underlying skull fracture. 3. No acute intracranial abnormality. Chronic small vessel disease. Electronically Signed   By: Genevie Ann M.D.   On: 10/14/2022 08:14    Scheduled Meds:  vitamin C  500 mg Oral Daily   aspirin EC  81 mg Oral Daily   docusate sodium  100 mg Oral BID   enoxaparin (LOVENOX) injection  40 mg Subcutaneous Q24H   lidocaine  3 patch Transdermal Q24H   memantine  5 mg Oral BID   methylPREDNISolone (SOLU-MEDROL) injection  0.5 mg/kg Intravenous Q12H   Followed by   Derrill Memo ON 10/17/2022] predniSONE  50 mg Oral Daily   midodrine  2.5 mg Oral TID WC   sertraline  150 mg  Oral QHS   sodium chloride flush  3 mL Intravenous Q12H   zinc sulfate  220 mg Oral Daily   Continuous Infusions:  sodium chloride  methocarbamol (ROBAXIN) IV       LOS: 1 day    Time spent: 50 mins    Ehtan Delfavero, MD [facility]   If 7PM-7AM, please contact night-coverage Appears comfortable, not in any acute distress.  For oral

## 2022-10-15 NOTE — TOC Initial Note (Signed)
Transition of Care [facility]) - Initial/Assessment Note    Patient Details  Name: Bethany Nichols MRN: [id number] Date of Birth: 1933/01/17  Transition of Care [facility]) CM/SW Contact:    Curlene Labrum, RN Phone Number: 10/15/2022, 10:16 AM  Clinical Narrative:                 CM spoke with Martinique Hartsoe, CM with [facility] in [place], [place] - [phone] to update regarding patient's pending medical needs in the hospital.  The patient is currently not medically stable for discharge at this time - pending medical stability in the next 3-4 days due to COVID, oxygen needs, pneumonia and UTI.  The patient was previously living at home alone with support from [facility] in [place].  The patient attended day center at [facility] 2 x per week prior to recent COVID infection.  The patient has RW and chronic oxygen at home after most recent infection.  CM and MSW will continue to follow the patient for [facility] needs for home - pending medical stability.    Barriers to Discharge: Continued Medical Work up   Patient Goals and CMS Choice        Expected Discharge Plan and Services         Living arrangements for the past 2 months: Single Family Home                                      Prior Living Arrangements/Services Living arrangements for the past 2 months: Single Family Home Lives with:: Self Patient language and need for interpreter reviewed:: Yes        Need for Family Participation in Patient Care: Yes (Comment) Care giver support system in place?: Yes (comment) Current home services: DME (Home Oxygen and RW) Criminal Activity/Legal Involvement Pertinent to Current Situation/Hospitalization: No - Comment as needed  Activities of Daily Living Home Assistive Devices/Equipment: Oxygen, Walker (specify type), Eyeglasses, Shower chair with back, Wheelchair, Grab bars in shower, Dentures (specify type) ADL Screening (condition at time of  admission) Patient's cognitive ability adequate to safely complete daily activities?: Yes Is the patient deaf or have difficulty hearing?: No Does the patient have difficulty seeing, even when wearing glasses/contacts?: Yes Does the patient have difficulty concentrating, remembering, or making decisions?: Yes Patient able to express need for assistance with ADLs?: Yes Does the patient have difficulty dressing or bathing?: No Independently performs ADLs?: Yes (appropriate for developmental age) Does the patient have difficulty walking or climbing stairs?: Yes Weakness of Legs: Both Weakness of Arms/Hands: Left  Permission Sought/Granted Permission sought to share information with : Case Manager Permission granted to share information with : Yes, Verbal Permission Granted     Permission granted to share info w AGENCY: [facility] ([facility] in [place], [place]) - Martinique Hartsoe, SW with [facility] [phone]  Permission granted to share info w Relationship: son, Naraya Stoneberg, son - [phone]     Emotional Assessment Appearance:: Appears stated age Attitude/Demeanor/Rapport: Gracious Affect (typically observed): Accepting   Alcohol / Substance Use: Not Applicable Psych Involvement: No (comment)  Admission diagnosis:  COVID-19 virus infection [U07.1] Patient Active Problem List   Diagnosis Date Noted   COVID-19 virus infection 10/14/2022   Hypotension 10/14/2022   Back pain 10/14/2022   COPD (chronic obstructive pulmonary disease) ([place]) 10/14/2022   DNR (do not resuscitate) 10/14/2022   AKI (acute  kidney injury) ([place]) 09/64/3838   Chronic systolic (congestive) heart failure ([place]) 07/31/2022   Depression and anxiety 07/31/2022   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

## 2022-10-15 NOTE — Progress Notes (Signed)
0145: Patient taken for stat CT by this RN 0300: Patient returned from CT by this RN

## 2022-10-15 NOTE — Progress Notes (Signed)
Mobility Specialist Progress Note:   10/15/22 1448  Mobility  Activity Ambulated with assistance in room  Level of Assistance Standby assist, set-up cues, supervision of patient - no hands on  Assistive Device Front wheel walker  Distance Ambulated (ft) 40 ft  Activity Response Tolerated well  Mobility Referral Yes  $Mobility charge 1 Mobility   Pt received in bed and eager. C/o severe back pain throughout. Pt left in bed with all needs met and call bell in reach.   Andrey Campanile Mobility Specialist Please contact via SecureChat or  Rehab office at [phone]

## 2022-10-15 NOTE — Evaluation (Addendum)
Physical Therapy Evaluation Patient Details Name: Bethany Nichols MRN: [id number] DOB: Mar 27, 1933 Today's Date: 10/15/2022  History of Present Illness  This 88 yrs old female admitted 12/17  s/p Fall 12/17.  She developed SOB.  She reports that she has developed COVID symptoms about 9 days ago and was tested positive on 12/9.  She usually wears supplemental oxygen 1 L/min at bedtime but has been short of breath and has needed continuous oxygen at 2 L/min.  Patient also reports generalized weakness and recurrent falls.  She hurt her nose in one of the falls and has developed diffuse chest pain and back pain.  CTA ruled out pulmonary embolism but shows multifocal pneumonia consistent with COVID. PMH significant for PPM placement, CVA, HTN, HLD, Anxiety/ Depression  Clinical Impression  Pt admitted with above diagnosis. Pt was able to ambulate with RW with min guard to min assist (+2 for safety) short distance with 2LO2 in place.   Hopeful with therapy that pt will be able to go home with HHPT and PACE services increase initially.  Will follow acutely. Pt currently with functional limitations due to the deficits listed below (see PT Problem List). Pt will benefit from skilled PT to increase their independence and safety with mobility to allow discharge to the venue listed below.          Recommendations for follow up therapy are one component of a multi-disciplinary discharge planning process, led by the attending physician.  Recommendations may be updated based on patient status, additional functional criteria and insurance authorization.  Follow Up Recommendations Home health PT (and possibly incr PACE services initially)  May need to incr O2 to daytime    Assistance Recommended at Discharge Intermittent Supervision/Assistance  Patient can return home with the following  A little help with walking and/or transfers;A little help with bathing/dressing/bathroom;Assistance with  cooking/housework;Assist for transportation;Help with stairs or ramp for entrance    Equipment Recommendations None recommended by PT  Recommendations for Other Services       Functional Status Assessment Patient has had a recent decline in their functional status and demonstrates the ability to make significant improvements in function in a reasonable and predictable amount of time.     Precautions / Restrictions Precautions Precautions: Fall Restrictions Weight Bearing Restrictions: No      Mobility  Bed Mobility Overal bed mobility: Needs Assistance Bed Mobility: Supine to Sit     Supine to sit: Min assist     General bed mobility comments: Assist due to pain back and LEs. Assist for trunk elevation and bending up Les.    Transfers Overall transfer level: Needs assistance Equipment used: Rolling walker (2 wheels) Transfers: Sit to/from Stand Sit to Stand: Min assist, +2 safety/equipment           General transfer comment: Pt needed a little assist to stand to her feet but once up, was steady with RW. Pt needed assist to remove mesh panties.    Ambulation/Gait Ambulation/Gait assistance: Min assist, +2 safety/equipment Gait Distance (Feet): 30 Feet Assistive device: Rolling walker (2 wheels) Gait Pattern/deviations: Step-through pattern, Decreased stride length, Shuffle, Drifts right/left   Gait velocity interpretation: <1.31 ft/sec, indicative of household ambulator   General Gait Details: Pt obtained balance fairly well with RW needing min assist to steady at times.  Pt with slow and cautious gait walking to sink and back.  Pt fatigues quickly and requring 2LO2 to maintain sats >88%.  Stairs  Wheelchair Mobility    Modified Rankin (Stroke Patients Only)       Balance Overall balance assessment: Needs assistance Sitting-balance support: No upper extremity supported, Feet supported Sitting balance-Leahy Scale: Fair     Standing  balance support: Bilateral upper extremity supported, During functional activity Standing balance-Leahy Scale: Poor Standing balance comment: relies on UE support                             Pertinent Vitals/Pain Pain Assessment Pain Assessment: 0-10 Pain Score: 5  Pain Location: chest and back Pain Descriptors / Indicators: Discomfort, Grimacing, Guarding Pain Intervention(s): Limited activity within patient's tolerance, Monitored during session, Repositioned    Home Living Family/patient expects to be discharged to:: Private residence Living Arrangements: Alone Available Help at Discharge: Family;Available PRN/intermittently Type of Home: House Home Access: Stairs to enter Entrance Stairs-Rails: None Entrance Stairs-Number of Steps: 2   Home Layout: One level Home Equipment: Conservation officer, nature (2 wheels);Rollator (4 wheels);Shower seat      Prior Function Prior Level of Function : Needs assist             Mobility Comments: pt ambulates with use of RW inside and rollator outdoors, family assist with transportation, patient goes to [facility] 2x a week ADLs Comments: pt is able to perform household ADLs including bathing dressing cooking cleaning. Family assists with grocery pickup     Hand Dominance   Dominant Hand: Right    Extremity/Trunk Assessment   Upper Extremity Assessment Upper Extremity Assessment: Defer to OT evaluation    Lower Extremity Assessment Lower Extremity Assessment: Generalized weakness    Cervical / Trunk Assessment Cervical / Trunk Assessment: Kyphotic (minimally)  Communication   Communication: No difficulties  Cognition Arousal/Alertness: Awake/alert Behavior During Therapy: WFL for tasks assessed/performed Overall Cognitive Status: Within Functional Limits for tasks assessed                                          General Comments      Exercises     Assessment/Plan    PT Assessment Patient needs  continued PT services  PT Problem List Decreased activity tolerance;Decreased balance;Decreased mobility;Decreased knowledge of use of DME;Decreased safety awareness;Decreased knowledge of precautions;Cardiopulmonary status limiting activity       PT Treatment Interventions DME instruction;Gait training;Functional mobility training;Therapeutic activities;Therapeutic exercise;Balance training;Patient/family education    PT Goals (Current goals can be found in the Care Plan section)  Acute Rehab PT Goals Patient Stated Goal: to get betteerr and go home PT Goal Formulation: With patient Time For Goal Achievement: 10/29/22 Potential to Achieve Goals: Good    Frequency Min 3X/week     Co-evaluation PT/OT/SLP Co-Evaluation/Treatment: Yes Reason for Co-Treatment: For patient/therapist safety PT goals addressed during session: Mobility/safety with mobility         AM-PAC PT "6 Clicks" Mobility  Outcome Measure Help needed turning from your back to your side while in a flat bed without using bedrails?: None Help needed moving from lying on your back to sitting on the side of a flat bed without using bedrails?: A Little Help needed moving to and from a bed to a chair (including a wheelchair)?: A Little Help needed standing up from a chair using your arms (e.g., wheelchair or bedside chair)?: A Lot Help needed to walk in hospital room?: A Lot Help needed  climbing 3-5 steps with a railing? : Total 6 Click Score: 15    End of Session Equipment Utilized During Treatment: Gait belt;Oxygen Activity Tolerance: Patient limited by fatigue Patient left: in chair;with call bell/phone within reach Nurse Communication: Mobility status PT Visit Diagnosis: Unsteadiness on feet (R26.81);Muscle weakness (generalized) (M62.81)    Time: 8473-0856 PT Time Calculation (min) (ACUTE ONLY): 29 min   Charges:   PT Evaluation $PT Eval Moderate Complexity: 1 Mod          Ainslie Mazurek M,PT Acute Rehab  Services [phone]   Alvira Philips 10/15/2022, 2:59 PM

## 2022-10-15 NOTE — Evaluation (Signed)
Occupational Therapy Evaluation Patient Details Name: Bethany Nichols MRN: [id number] DOB: Nov 14, 1932 Today's Date: 10/15/2022   History of Present Illness This 88 yrs old female admitted 12/17  s/p Fall 12/17.  She developed SOB.  She reports that she has developed COVID symptoms about 9 days ago and was tested positive on 12/9.  She usually wears supplemental oxygen 1 L/min at bedtime but has been short of breath and has needed continuous oxygen at 2 L/min.  Patient also reports generalized weakness and recurrent falls.  She hurt her nose in one of the falls and has developed diffuse chest pain,  back pain and is quite reproducible.  CTA ruled out pulmonary embolism but shows multifocal pneumonia consistent with COVID. PMH significant for PPM placement, CVA, HTN, HLD, Anxiety/ Depression   Clinical Impression   Prior to this admission, patient living alone, going to [facility] 2[facility], and able to complete ADLs independently. Patient using a rolling walker or rollator to ambulate. Currently, patient presenting with need for 3L of oxygen to maintain O2 sats, and increased assist with mobility and all ADLs  (min A for all activity) and poor activity tolerance. OT recommending increased PACE services to promote increased activity tolerance and overall abilities. If PACE cannot be increased, OT recommending short term SNF placement as patient does not have significant family support to provide 24/7 assist.      Recommendations for follow up therapy are one component of a multi-disciplinary discharge planning process, led by the attending physician.  Recommendations may be updated based on patient status, additional functional criteria and insurance authorization.   Follow Up Recommendations  Home health OT (Increase PACE services including aide, otherwise will need SNF placement)     Assistance Recommended at Discharge Frequent or constant Supervision/Assistance  Patient can return home with the  following A little help with walking and/or transfers;A lot of help with bathing/dressing/bathroom;Assistance with cooking/housework;Direct supervision/assist for medications management;Direct supervision/assist for financial management;Assist for transportation;Help with stairs or ramp for entrance    Functional Status Assessment  Patient has had a recent decline in their functional status and demonstrates the ability to make significant improvements in function in a reasonable and predictable amount of time.  Equipment Recommendations  None recommended by OT (Patient has DME needed)    Recommendations for Other Services       Precautions / Restrictions Precautions Precautions: Fall Restrictions Weight Bearing Restrictions: No      Mobility Bed Mobility Overal bed mobility: Needs Assistance Bed Mobility: Supine to Sit     Supine to sit: Min assist     General bed mobility comments: Assist due to pain back and LEs. Assist for trunk elevation and bending up Les.    Transfers Overall transfer level: Needs assistance Equipment used: Rolling walker (2 wheels) Transfers: Sit to/from Stand Sit to Stand: Min assist, +2 safety/equipment           General transfer comment: Pt needed a little assist to stand to her feet but once up, was steady with RW. Pt needed assist to remove mesh panties.      Balance Overall balance assessment: Needs assistance Sitting-balance support: No upper extremity supported, Feet supported Sitting balance-Leahy Scale: Fair     Standing balance support: Bilateral upper extremity supported, During functional activity Standing balance-Leahy Scale: Poor Standing balance comment: relies on UE support  ADL either performed or assessed with clinical judgement   ADL Overall ADL's : Needs assistance/impaired Eating/Feeding: Minimal assistance;Sitting   Grooming: Minimal assistance;Sitting   Upper Body Bathing:  Minimal assistance;Sitting   Lower Body Bathing: Moderate assistance;Sitting/lateral leans;Sit to/from stand   Upper Body Dressing : Minimal assistance;Sitting   Lower Body Dressing: Moderate assistance;Sitting/lateral leans;Sit to/from stand   Toilet Transfer: Minimal assistance;Ambulation   Toileting- Clothing Manipulation and Hygiene: Minimal assistance;Sitting/lateral lean;Sit to/from stand       Functional mobility during ADLs: Minimal assistance;Rolling walker (2 wheels);Cueing for sequencing;Cueing for safety General ADL Comments: Patient presenting with decreased activity tolerance, and need for increased assist with ADLs     Vision Baseline Vision/History: 1 Wears glasses Ability to See in Adequate Light: 0 Adequate Patient Visual Report: No change from baseline       Perception     Praxis      Pertinent Vitals/Pain Pain Assessment Pain Assessment: 0-10 Pain Score: 5  Pain Location: chest and back Pain Descriptors / Indicators: Discomfort, Grimacing, Guarding Pain Intervention(s): Limited activity within patient's tolerance, Monitored during session, Repositioned     Hand Dominance Right   Extremity/Trunk Assessment Upper Extremity Assessment Upper Extremity Assessment: Generalized weakness   Lower Extremity Assessment Lower Extremity Assessment: Defer to PT evaluation   Cervical / Trunk Assessment Cervical / Trunk Assessment: Kyphotic (minimally)   Communication Communication Communication: No difficulties   Cognition Arousal/Alertness: Awake/alert Behavior During Therapy: WFL for tasks assessed/performed Overall Cognitive Status: Within Functional Limits for tasks assessed                                       General Comments       Exercises     Shoulder Instructions      Home Living Family/patient expects to be discharged to:: Private residence Living Arrangements: Alone Available Help at Discharge: Family;Available  PRN/intermittently Type of Home: House Home Access: Stairs to enter [employer] of Steps: 2 Entrance Stairs-Rails: None Home Layout: One level     Bathroom Shower/Tub: Tub only   Biochemist, clinical: Standard     Home Equipment: Conservation officer, nature (2 wheels);Rollator (4 wheels);Shower seat          Prior Functioning/Environment Prior Level of Function : Needs assist             Mobility Comments: pt ambulates with use of RW inside and rollator outdoors, family assist with transportation, patient goes to [facility] 2x a week ADLs Comments: pt is able to perform household ADLs including bathing dressing cooking cleaning. Family assists with grocery pickup        OT Problem List: Decreased strength;Decreased range of motion;Decreased activity tolerance;Impaired balance (sitting and/or standing);Decreased coordination;Decreased safety awareness;Decreased knowledge of use of DME or AE      OT Treatment/Interventions: Self-care/ADL training;Therapeutic exercise;Energy conservation;DME and/or AE instruction;Manual therapy;Therapeutic activities;Patient/family education;Balance training    OT Goals(Current goals can be found in the care plan section) Acute Rehab OT Goals Patient Stated Goal: to get better OT Goal Formulation: With patient Time For Goal Achievement: 10/29/22 Potential to Achieve Goals: Good ADL Goals Pt Will Perform Lower Body Bathing: with set-up;sitting/lateral leans;sit to/from stand Pt Will Perform Lower Body Dressing: with set-up;sitting/lateral leans;sit to/from stand Pt Will Transfer to Toilet: ambulating;with set-up Pt Will Perform Toileting - Clothing Manipulation and hygiene: with set-up;sitting/lateral leans;sit to/from stand Additional ADL Goal #1: Patient will be able to complete  functional task in standing for 3-5 minutes without need for seated rest break to increase overall activity tolerance.  OT Frequency: Min 3X/week    Co-evaluation    Reason for Co-Treatment: For patient/therapist safety PT goals addressed during session: Mobility/safety with mobility OT goals addressed during session: ADL's and self-care      AM-PAC OT "6 Clicks" Daily Activity     Outcome Measure Help from another person eating meals?: A Little Help from another person taking care of personal grooming?: A Little Help from another person toileting, which includes using toliet, bedpan, or urinal?: A Little Help from another person bathing (including washing, rinsing, drying)?: A Lot Help from another person to put on and taking off regular upper body clothing?: A Little Help from another person to put on and taking off regular lower body clothing?: A Lot 6 Click Score: 16   End of Session Equipment Utilized During Treatment: Gait belt;Rolling walker (2 wheels) Nurse Communication: Mobility status  Activity Tolerance: Patient limited by fatigue;Patient limited by lethargy Patient left: in chair;with call bell/phone within reach  OT Visit Diagnosis: Unsteadiness on feet (R26.81);Other abnormalities of gait and mobility (R26.89);Muscle weakness (generalized) (M62.81);History of falling (Z91.81);Repeated falls (R29.6)                Time: 7741-4239 OT Time Calculation (min): 38 min Charges:  OT General Charges $OT Visit: 1 Visit OT Evaluation $OT Eval Moderate Complexity: 1 Mod  Corinne Ports E. Marsha Gundlach, OTR/L Acute Rehabilitation Services [phone]   Ascencion Dike 10/15/2022, 3:50 PM

## 2022-10-16 DIAGNOSIS — U071 COVID-19: Secondary | ICD-10-CM | POA: Diagnosis not present

## 2022-10-16 LAB — COMPREHENSIVE METABOLIC PANEL
ALT: 16 U/L (ref 0–44)
AST: 16 U/L (ref 15–41)
Albumin: 3 g/dL — ABNORMAL LOW (ref 3.5–5.0)
Alkaline Phosphatase: 62 U/L (ref 38–126)
Anion gap: 9 (ref 5–15)
BUN: 19 mg/dL (ref 8–23)
CO2: 24 mmol/L (ref 22–32)
Calcium: 8.7 mg/dL — ABNORMAL LOW (ref 8.9–10.3)
Chloride: 98 mmol/L (ref 98–111)
Creatinine, Ser: 0.68 mg/dL (ref 0.44–1.00)
GFR, Estimated: >60 mL/min (ref >60)
Glucose, Bld: 130 mg/dL — ABNORMAL HIGH (ref 70–99)
Potassium: 3.9 mmol/L (ref 3.5–5.1)
Sodium: 131 mmol/L — ABNORMAL LOW (ref 135–145)
Total Bilirubin: 0.7 mg/dL (ref 0.3–1.2)
Total Protein: 6.5 g/dL (ref 6.5–8.1)

## 2022-10-16 LAB — CBC WITH DIFFERENTIAL/PLATELET
Abs Immature Granulocytes: 0.17 10*3/uL — ABNORMAL HIGH (ref 0.00–0.07)
Basophils Absolute: 0 10*3/uL (ref 0.0–0.1)
Basophils Relative: 0 %
Eosinophils Absolute: 0 10*3/uL (ref 0.0–0.5)
Eosinophils Relative: 0 %
HCT: 39.9 % (ref 36.0–46.0)
Hemoglobin: 13.4 g/dL (ref 12.0–15.0)
Immature Granulocytes: 2 %
Lymphocytes Relative: 13 %
Lymphs Abs: 1.5 10*3/uL (ref 0.7–4.0)
MCH: 30 pg (ref 26.0–34.0)
MCHC: 33.6 g/dL (ref 30.0–36.0)
MCV: 89.5 fL (ref 80.0–100.0)
Monocytes Absolute: 0.8 10*3/uL (ref 0.1–1.0)
Monocytes Relative: 7 %
Neutro Abs: 8.8 10*3/uL — ABNORMAL HIGH (ref 1.7–7.7)
Neutrophils Relative %: 78 %
Platelets: 306 10*3/uL (ref 150–400)
RBC: 4.46 MIL/uL (ref 3.87–5.11)
RDW: 14.5 % (ref 11.5–15.5)
WBC: 11.2 10*3/uL — ABNORMAL HIGH (ref 4.0–10.5)
nRBC: 0 % (ref 0.0–0.2)

## 2022-10-16 LAB — BRAIN NATRIURETIC PEPTIDE: B Natriuretic Peptide: 2728.1 pg/mL — ABNORMAL HIGH (ref 0.0–100.0)

## 2022-10-16 NOTE — Progress Notes (Signed)
PROGRESS NOTE    Bethany Nichols  YBO:[id number] DOB: [birth year] DOA: 10/14/2022 PCP: [facility]   Brief Narrative:  This 88 yrs old female with PMH significant for PPM placement, CVA, HTN, HLD, Anxiety/ Depression presented  s/p Fall yesterday,  She has developed SOB next day.  She reports that she has developed COVID symptoms about 9 days ago and was tested positive on 12/9.  She usually wears supplemental oxygen 1 L/min at bedtime but has been short of breath and has needed continuous oxygen at 2 L/min.  Patient also reports generalized weakness and recurrent falls.  She hurt her nose in one of the falls and has developed diffuse chest pain,  back pain and is quite reproducible.  CTA ruled out pulmonary embolism but shows multifocal pneumonia consistent with COVID.  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 to COVID-19 PNA: Patient with COPD on nocturnal oxygen presented with SOB and hypoxia, SpO2 80% on room air. COVID test+ on 10/06/2022. Patient has comorbidities which may increase the risk of ARDS including age, HTN, COPD, CHF. Patient reports generalized weakness and has chest and back pain.   Chest x-ray shows multifocal opacities consistent with COVID. Hold on antibiotics since procalcitonin less than 0.5.   CTA ruled out pulmonary embolism but shows multifocal pneumonia consistent with COVID Continue steroids and she has completed Paxlovid treatment. PT and OT eval. Encourage mobilization and ambulation as much as possible  Hypotension: Continue midodrine 2.5 mg TID.  History of CVA: Continue  aspirin.   Patient has mild residual left-sided weakness. She is not on a statin.  Chronic systolic CHF: Appears euvolemic.   Last echo 10/23 shows LVEF 30 to 35%, nonischemic BNP 2728, better than before.  Anxiety/depression: Continue Zoloft, Namenda. Continue delirium precautions.  Back pain: Patient has chronic compression fractures in addition to her acute issues which may be contributing to her weakness. Continue lidocaine patch for her back pain, in addition to pain meds. PT and OT eval  S/p pacemaker: No issues. F/u Outpatient  COPD: Continue supplemental oxygen.   Continue home inhalers.   DVT prophylaxis: Lovenox Code Status: Full code Family Communication: No family at bed side. Disposition Plan:    Status is: Inpatient Remains inpatient appropriate because: Admitted for acute on chronic hypoxic respiratory failure secondary to COVID-pneumonia.  Patient is improving.  PT and OT eval  recommended HH PT/OT.   Consultants:  None  Procedures: CTA chest  Antimicrobials:  Anti-infectives (From admission, onward)    None       Subjective: Patient was seen and examined at bedside.  Overnight events noted.   Patient reports doing better, She remains on 2.5 L of supplemental oxygen. She states she had a rough night last night,  could not sleep well. Patient has a left forehead laceration from fall.  Objective: Vitals:   10/15/22 0834 10/15/22 1700 10/16/22 0411 10/16/22 0743  BP: 135/76 131/72 132/75 138/70  Pulse: 71 72 69 68  Resp: '18 18  18  '$ Temp: 97.7 F (36.5 C) 98.1 F (36.7 C)  98.4 F (36.9 C)  TempSrc: Oral  Oral  Oral  SpO2: 95% 95% 96% 98%  Weight:        Intake/Output Summary (Last 24 hours) at 10/16/2022 1127 Last data filed at 10/16/2022 0423 Gross per 24 hour  Intake 670 ml  Output 1000 ml  Net -330 ml   Filed Weights   10/14/22 1800  Weight: 60 kg    Examination:  General exam: Appears comfortable, not in any acute  distress, forehead laceration noted. Respiratory system: CTA bilaterally, respiratory effort normal, RR 13 Cardiovascular system: S1 & S2 heard, regular rate and rhythm, no murmur.  PPM noted Gastrointestinal system: Abdomen is soft, non tender, non distended, BS+ Central nervous system: Alert and oriented x 2. No focal neurological deficits. Extremities: No edema, no cyanosis, no clubbing. Skin: No rashes, lesions or ulcers Psychiatry: Mood & affect appropriate.     Data Reviewed: I have personally reviewed following labs and imaging studies  CBC: Recent Labs  Lab 10/14/22 0635 10/15/22 0318 10/16/22 0306  WBC 12.7* 9.8 11.2*  NEUTROABS  --  8.4* 8.8*  HGB 12.6 13.4 13.4  HCT 37.6 39.7 39.9  MCV 88.7 89.2 89.5  PLT 318 310 025   Basic Metabolic Panel: Recent Labs  Lab 10/14/22 0635 10/15/22 0318 10/16/22 0306  NA 129* 131* 131*  K 3.6 4.0 3.9  CL 94* 99 98  CO2 '25 22 24  '$ GLUCOSE 104* 132* 130*  BUN 24* 20 19  CREATININE 0.82 0.52 0.68  CALCIUM 8.9 8.4* 8.7*   GFR: Estimated Creatinine Clearance: 41.2 mL/min (by C-G formula based on SCr of 0.68 mg/dL). Liver Function Tests: Recent Labs  Lab 10/14/22 0635 10/15/22 0318 10/16/22 0306  AST 34 21 16  ALT '24 18 16  '$ ALKPHOS 56 58 62  BILITOT 0.7 0.8 0.7  PROT 6.7 6.4* 6.5  ALBUMIN 3.3* 3.0* 3.0*   No results for input(s): "LIPASE", "AMYLASE" in the last 168 hours. No results for input(s): "AMMONIA" in the last 168 hours. Coagulation Profile: No results for input(s): "INR", "PROTIME" in the last 168 hours. Cardiac Enzymes: No results for input(s): "CKTOTAL", "CKMB", "CKMBINDEX", "TROPONINI" in the last 168 hours. BNP (last 3 results) No results for input(s): "PROBNP" in the last 8760 hours. HbA1C: No results for input(s): "HGBA1C" in the last 72 hours. CBG: No results for input(s): "GLUCAP" in the last 168 hours. Lipid Profile: No results for input(s): "CHOL", "HDL", "LDLCALC", "TRIG", "CHOLHDL",  "LDLDIRECT" in the last 72 hours. Thyroid Function Tests: No results for input(s): "TSH", "T4TOTAL", "FREET4", "T3FREE", "THYROIDAB" in the last 72 hours. Anemia Panel: Recent Labs    10/14/22 1725  FERRITIN 102   Sepsis Labs: Recent Labs  Lab 10/14/22 1725  PROCALCITON <0.10    Recent Results (from the past 240 hour(s))  Resp panel by RT-PCR (RSV, Flu A&B, Covid) Anterior Nasal Swab     Status: Abnormal   Collection Time: 10/14/22  2:12 PM   Specimen: Anterior Nasal Swab  Result Value Ref Range Status   SARS Coronavirus 2 by RT PCR POSITIVE (A) NEGATIVE Final    Comment: (NOTE) SARS-CoV-2 target nucleic acids are DETECTED.  The SARS-CoV-2 RNA is generally detectable in upper respiratory specimens during the acute phase of infection. Positive results are indicative of the presence of the identified virus, but do not rule out bacterial infection or co-infection with other pathogens not detected by the test. Clinical correlation with patient history and other diagnostic information is necessary to determine patient infection status. The expected result is Negative.  Fact Sheet for Patients: EntrepreneurPulse.com.au  Fact Sheet for Healthcare Providers: IncredibleEmployment.be  This test is not yet approved or cleared by the [place] FDA and  has been authorized for detection and/or diagnosis of SARS-CoV-2 by FDA under an Emergency Use Authorization (EUA).  This EUA will remain in effect (meaning this test can be used) for the duration of  the COVID-19 declaration under Section 564(b)(1) of the A ct, 21 U.S.C. section 360bbb-3(b)(1), unless the authorization is terminated or revoked sooner.     Influenza A by PCR NEGATIVE NEGATIVE Final   Influenza B by PCR NEGATIVE NEGATIVE Final    Comment: (NOTE) The Xpert Xpress SARS-CoV-2/FLU/RSV plus assay is intended as an aid in the diagnosis of influenza from Nasopharyngeal swab  specimens and should not be used as a sole basis for treatment. Nasal washings and aspirates are unacceptable for Xpert Xpress SARS-CoV-2/FLU/RSV testing.  Fact Sheet for Patients: EntrepreneurPulse.com.au  Fact Sheet for Healthcare Providers: IncredibleEmployment.be  This test is not yet approved or cleared by the [place] FDA and has been authorized for detection and/or diagnosis of SARS-CoV-2 by FDA under an Emergency Use Authorization (EUA). This EUA will remain in effect (meaning this test can be used) for the duration of the COVID-19 declaration under Section 564(b)(1) of the Act, 21 U.S.C. section 360bbb-3(b)(1), unless the authorization is terminated or revoked.     Resp Syncytial Virus by PCR NEGATIVE NEGATIVE Final    Comment: (NOTE) Fact Sheet for Patients: EntrepreneurPulse.com.au  Fact Sheet for Healthcare Providers: IncredibleEmployment.be  This test is not yet approved or cleared by the [place] FDA and has been authorized for detection and/or diagnosis of SARS-CoV-2 by FDA under an Emergency Use Authorization (EUA). This EUA will remain in effect (meaning this test can be used) for the duration of the COVID-19 declaration under Section 564(b)(1) of the Act, 21 U.S.C. section 360bbb-3(b)(1), unless the authorization is terminated or revoked.  Performed at [facility], [place] [address], [place], [place] [address]     Radiology Studies: CT Angio Chest PE W and/or Wo Contrast  Result Date: 10/15/2022 CLINICAL DATA:  Status post fall, chest pain, evaluate for PE EXAM: CT ANGIOGRAPHY CHEST WITH CONTRAST TECHNIQUE: Multidetector CT imaging of the chest was performed using the standard protocol during bolus administration of intravenous contrast. Multiplanar CT image reconstructions and MIPs were obtained to evaluate the vascular anatomy. RADIATION DOSE REDUCTION: This exam was  performed according to the departmental dose-optimization program which includes automated exposure control, adjustment of the mA and/or kV according to patient size and/or use of iterative reconstruction technique. CONTRAST:  67m OMNIPAQUE IOHEXOL 350 MG/ML SOLN COMPARISON:  Chest radiograph and thoracic spine CT dated 10/14/2022 FINDINGS: Cardiovascular: Satisfactory opacification the bilateral pulmonary arteries to the lobar level. No evidence of pulmonary embolism. Study is not tailored for evaluation of thoracic aorta. No evidence of thoracic aortic aneurysm. Atherosclerotic calcifications of the arch. Cardiomegaly.  No pericardial effusion.  Left subclavian pacemaker. Very mild coronary atherosclerosis of the LAD. Mediastinum/Nodes: Small mediastinal lymph nodes which do not meet pathologic CT size criteria. Visualized thyroid is unremarkable. Lungs/Pleura: Multifocal patchy/ground-glass opacities in the lungs bilaterally, suggesting multifocal pneumonia. Superimposed bilateral lower lobe scarring/atelectasis. No suspicious pulmonary nodules. No pleural effusion or pneumothorax. Upper Abdomen: Visualized upper abdomen is grossly unremarkable, noting vascular calcifications. Musculoskeletal: Old bilateral rib fracture deformities. Chronic thoracic compression fracture deformities, including a moderate to severe T7 fracture, unchanged from recent thoracic spine CT. Review of the MIP images  confirms the above findings. IMPRESSION: No evidence of pulmonary embolism. Multifocal patchy/interstitial opacities, favoring multifocal pneumonia. Additional ancillary findings as above. Aortic Atherosclerosis (ICD10-I70.0). Electronically Signed   By: Julian Hy M.D.   On: 10/15/2022 03:13    Scheduled Meds:  vitamin C  500 mg Oral Daily   aspirin EC  81 mg Oral Daily   docusate sodium  100 mg Oral BID   enoxaparin (LOVENOX) injection  40 mg Subcutaneous Q24H   lidocaine  3 patch Transdermal Q24H   memantine   5 mg Oral BID   methylPREDNISolone (SOLU-MEDROL) injection  0.5 mg/kg Intravenous Q12H   Followed by   Derrill Memo ON 10/17/2022] predniSONE  50 mg Oral Daily   midodrine  2.5 mg Oral TID WC   sertraline  150 mg Oral QHS   sodium chloride flush  3 mL Intravenous Q12H   zinc sulfate  220 mg Oral Daily   Continuous Infusions:  sodium chloride     methocarbamol (ROBAXIN) IV       LOS: 2 days    Time spent: 35 mins    Tika Hannis, MD [facility]   If 7PM-7AM, please contact night-coverage Appears comfortable, not in any acute distress.  For oral

## 2022-10-16 NOTE — Progress Notes (Signed)
Mobility Specialist Progress Note    10/16/22 1238  Mobility  Activity Ambulated with assistance in room  Level of Assistance Minimal assist, patient does 75% or more  Assistive Device Front wheel walker  Distance Ambulated (ft) 40 ft  Activity Response Tolerated fair  Mobility Referral Yes  $Mobility charge 1 Mobility   Pt received in bed and agreeable. Pt SOB with exertion. Pt c/o being low energy. Encouraged pursed lip breathing. Left in chair with call bell in reach.   Hildred Alamin Mobility Specialist  Please Psychologist, sport and exercise or Rehab Office at [phone]

## 2022-10-16 NOTE — TOC Progression Note (Addendum)
Transition of Care [facility]) - Progression Note    Patient Details  Name: Bethany Nichols MRN: [id number] Date of Birth: Jan 27, 1933  Transition of Care [facility]) CM/SW [place], RN Phone Number: 10/16/2022, 10:23 AM  Clinical Narrative:    CM met with the patient at the bedside to discuss transitions of care needs.  The patient lives alone during the day and states that her son, [name] lives next door but has stage 4 cancer and is unable to provide care nor 24-hour supervision at the home.  The patient remains weak and SOB in the room on 2 liters/min [place] oxygen.  She was agreeable to SNF placement.  I called and left a message with Martinique Hartsoe, CM at [facility] in [place], [place] to discuss.  FL2 will be completed and faxed out in the hub for review.  I called the patient's son and was unable to leave voicemail message.  CM will follow up with [facility] regarding SNF placement for 24 hour supervision/care.  10/16/2022 1118 - CM called and spoke with Martinique Hartsoe, SW at [facility] in [place], [place] and she states that they are unable to provide 24 hour supervision at the home but are able to provide transportation to the center 5 days a week for therapy and care.  Hartsoe, SW states that she will reach out to the patient's granddaughter and sister to see if they can provide 24 hour supervision at the home.  I explained that the patient is weak, SOB and requiring assistance with ADL and mobility and requested SNF if [facility] unable to provide family support for 24 hour care.  FL2 was completed as a back up plan and faxed out in the hub.  [facility] is aware and will reach out to family in the meantime for 24 hour support in the home.  10/16/2022 1522 - I spoke with Martinique Hartsoe, SW with [facility] in [place] and they are willing to support SNF placement at [facility] in [place],  I called and spoke with Martinique Genove, CM with [facility] and he is checking on bed availability.  Attending physician  was updated.  If bed offer made - [facility] plans to provide [facility] transportation tomorrow to the facility if bed available and pt medically stable for discharge.  Expected Discharge Plan: [place] Barriers to Discharge: Continued Medical Work up  Expected Discharge Plan and Services Expected Discharge Plan: [place] Choice: [place] arrangements for the past 2 months: Single Family Home                                       Social Determinants of Health (SDOH) Interventions    Readmission Risk Interventions    10/16/2022   10:20 AM  Readmission Risk Prevention Plan  Post Dischage Appt Complete  Medication Screening Complete  Transportation Screening Complete

## 2022-10-16 NOTE — NC FL2 (Signed)
[place] LEVEL OF CARE FORM     IDENTIFICATION  Patient Name: Bethany Nichols Birthdate: 21-Jan-1933 Sex: female Admission Date (Current Location): 10/14/2022  [facility] and [place] Number:  Herbalist and Address:  [facility], [place] [address], [place], [place] [address]      Provider Number: [id number]  Attending Physician Name and Address:  Shawna Clamp, MD  Relative Name and Phone Number:  Kenlee Vogt, son - [phone]    Current Level of Care: Hospital Recommended Level of Care: [place] Prior Approval Number:    Date Approved/Denied:   PASRR Number: [id number]  Discharge Plan: SNF    Current Diagnoses: Patient Active Problem List   Diagnosis Date Noted   COVID-19 virus infection 10/14/2022   Hypotension 10/14/2022   Back pain 10/14/2022   COPD (chronic obstructive pulmonary disease) ([place]) 10/14/2022   DNR (do not resuscitate) 10/14/2022   AKI (acute kidney injury) ([place]) 29/93/7169   Chronic systolic (congestive) heart failure ([place]) 07/31/2022   Depression and anxiety 07/31/2022   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 07/31/2022   Intermittent confusion 07/31/2022   Acute respiratory failure with hypoxia ([place]) 07/31/2022   Complete AV block s/p PPM  04/16/2019   Bradycardia 04/08/2019    Orientation RESPIRATION BLADDER Height & Weight     Self, Time, Situation  O2 (2 Liters/min [place] oxygen) External catheter Weight: 60 kg Height:     BEHAVIORAL SYMPTOMS/MOOD NEUROLOGICAL BOWEL NUTRITION STATUS      Continent Diet (See discharge summary)  AMBULATORY STATUS COMMUNICATION OF NEEDS Skin   Extensive Assist Verbally Other (Comment) (Bruising on face)                       Personal Care Assistance Level of Assistance  Bathing, Feeding, Dressing Bathing Assistance: Limited assistance Feeding assistance: Limited assistance Dressing Assistance: Limited  assistance     Functional Limitations Info  Sight, Hearing, Speech Sight Info: Adequate Hearing Info: Impaired Speech Info: Adequate    SPECIAL CARE FACTORS FREQUENCY  PT (By licensed PT), OT (By licensed OT)     PT Frequency: 3-5 x per week OT Frequency: 3-5 x per week            Contractures Contractures Info: Not present    Additional Factors Info  Code Status, Allergies Code Status Info: DNR Allergies Info: Levaquin, Allegra           Current Medications (10/16/2022):  This is the current hospital active medication list Current Facility-Administered Medications  Medication Dose Route Frequency Provider Last Rate Last Admin   0.9 %  sodium chloride infusion   Intravenous PRN Karmen Bongo, MD       acetaminophen (TYLENOL) tablet 650 mg  650 mg Oral Q6H PRN Karmen Bongo, MD   650 mg at 10/14/22 2110   albuterol (VENTOLIN HFA) 108 (90 Base) MCG/ACT inhaler 2 puff  2 puff Inhalation Q4H PRN Karmen Bongo, MD       ascorbic acid (VITAMIN C) tablet 500 mg  500 mg Oral Daily Karmen Bongo, MD   500 mg at 10/16/22 6789   aspirin EC tablet 81 mg  81 mg Oral Daily Karmen Bongo, MD   81 mg at 10/16/22 0851   bisacodyl (DULCOLAX) EC tablet 5 mg  5 mg Oral Daily PRN Karmen Bongo, MD       chlorpheniramine-HYDROcodone (TUSSIONEX) 10-8 MG/5ML suspension 5 mL  5 mL  Oral Q12H PRN Karmen Bongo, MD       docusate sodium (COLACE) capsule 100 mg  100 mg Oral BID Karmen Bongo, MD   100 mg at 10/16/22 0852   enoxaparin (LOVENOX) injection 40 mg  40 mg Subcutaneous Q24H Ventura Sellers, RPH   40 mg at 10/15/22 1807   guaiFENesin-dextromethorphan (ROBITUSSIN DM) 100-10 MG/5ML syrup 10 mL  10 mL Oral Q4H PRN Karmen Bongo, MD       lidocaine (LIDODERM) 5 % 3 patch  3 patch Transdermal Q24H Karmen Bongo, MD   3 patch at 10/16/22 0036   memantine (NAMENDA) tablet 5 mg  5 mg Oral BID Karmen Bongo, MD   5 mg at 10/16/22 2595   methocarbamol (ROBAXIN) 500 mg in  dextrose 5 % 50 mL IVPB  500 mg Intravenous Q6H PRN Karmen Bongo, MD       methylPREDNISolone sodium succinate (SOLU-MEDROL) 40 mg/mL injection 30 mg  0.5 mg/kg Intravenous Lillia Mountain, MD   30 mg at 10/16/22 6387   Followed by   Derrill Memo ON 10/17/2022] predniSONE (DELTASONE) tablet 50 mg  50 mg Oral Daily Karmen Bongo, MD       midodrine (PROAMATINE) tablet 2.5 mg  2.5 mg Oral TID WC Karmen Bongo, MD   2.5 mg at 10/16/22 0851   ondansetron (ZOFRAN) tablet 4 mg  4 mg Oral Q6H PRN Karmen Bongo, MD       Or   ondansetron [facility]) injection 4 mg  4 mg Intravenous Q6H PRN Karmen Bongo, MD   4 mg at 10/16/22 1032   oxyCODONE (Oxy IR/ROXICODONE) immediate release tablet 5 mg  5 mg Oral Q4H PRN Karmen Bongo, MD   5 mg at 10/15/22 1515   polyethylene glycol (MIRALAX / GLYCOLAX) packet 17 g  17 g Oral Daily PRN Karmen Bongo, MD       sertraline (ZOLOFT) tablet 150 mg  150 mg Oral Ivery Quale, MD   150 mg at 10/16/22 0034   sodium chloride flush (NS) 0.9 % injection 3 mL  3 mL Intravenous Lillia Mountain, MD   3 mL at 10/16/22 5643   sodium chloride flush (NS) 0.9 % injection 3 mL  3 mL Intravenous PRN Karmen Bongo, MD       sodium phosphate (FLEET) 7-19 GM/118ML enema 1 enema  1 enema Rectal Once PRN Karmen Bongo, MD       zinc sulfate capsule 220 mg  220 mg Oral Daily Karmen Bongo, MD   220 mg at 10/16/22 3295     Discharge Medications: Please see discharge summary for a list of discharge medications.  Relevant Imaging Results:  Relevant Lab Results:   Additional Information SSN [place], [place]

## 2022-10-17 DIAGNOSIS — U071 COVID-19: Secondary | ICD-10-CM | POA: Diagnosis not present

## 2022-10-17 LAB — CBC WITH DIFFERENTIAL/PLATELET
Abs Immature Granulocytes: 0.16 10*3/uL — ABNORMAL HIGH (ref 0.00–0.07)
Basophils Absolute: 0 10*3/uL (ref 0.0–0.1)
Basophils Relative: 0 %
Eosinophils Absolute: 0 10*3/uL (ref 0.0–0.5)
Eosinophils Relative: 0 %
HCT: 39.1 % (ref 36.0–46.0)
Hemoglobin: 13.3 g/dL (ref 12.0–15.0)
Immature Granulocytes: 2 %
Lymphocytes Relative: 12 %
Lymphs Abs: 1.3 10*3/uL (ref 0.7–4.0)
MCH: 30.6 pg (ref 26.0–34.0)
MCHC: 34 g/dL (ref 30.0–36.0)
MCV: 90.1 fL (ref 80.0–100.0)
Monocytes Absolute: 0.6 10*3/uL (ref 0.1–1.0)
Monocytes Relative: 6 %
Neutro Abs: 8.6 10*3/uL — ABNORMAL HIGH (ref 1.7–7.7)
Neutrophils Relative %: 80 %
Platelets: 296 10*3/uL (ref 150–400)
RBC: 4.34 MIL/uL (ref 3.87–5.11)
RDW: 14.7 % (ref 11.5–15.5)
WBC: 10.7 10*3/uL — ABNORMAL HIGH (ref 4.0–10.5)
nRBC: 0 % (ref 0.0–0.2)

## 2022-10-17 LAB — MAGNESIUM: Magnesium: 2.1 mg/dL (ref 1.7–2.4)

## 2022-10-17 LAB — COMPREHENSIVE METABOLIC PANEL
ALT: 14 U/L (ref 0–44)
AST: 19 U/L (ref 15–41)
Albumin: 2.9 g/dL — ABNORMAL LOW (ref 3.5–5.0)
Alkaline Phosphatase: 56 U/L (ref 38–126)
Anion gap: 9 (ref 5–15)
BUN: 21 mg/dL (ref 8–23)
CO2: 26 mmol/L (ref 22–32)
Calcium: 8.8 mg/dL — ABNORMAL LOW (ref 8.9–10.3)
Chloride: 98 mmol/L (ref 98–111)
Creatinine, Ser: 0.62 mg/dL (ref 0.44–1.00)
GFR, Estimated: >60 mL/min (ref >60)
Glucose, Bld: 129 mg/dL — ABNORMAL HIGH (ref 70–99)
Potassium: 4.5 mmol/L (ref 3.5–5.1)
Sodium: 133 mmol/L — ABNORMAL LOW (ref 135–145)
Total Bilirubin: 0.3 mg/dL (ref 0.3–1.2)
Total Protein: 6.4 g/dL — ABNORMAL LOW (ref 6.5–8.1)

## 2022-10-17 LAB — PHOSPHORUS: Phosphorus: 3.6 mg/dL (ref 2.5–4.6)

## 2022-10-17 MED ORDER — DOCUSATE SODIUM 100 MG PO CAPS: 100.0000 mg | ORAL_CAPSULE | Freq: Two times a day (BID) | ORAL | 0 refills | Status: AC

## 2022-10-17 MED ORDER — LIDOCAINE 5 % EX PTCH: 1.0000 | MEDICATED_PATCH | CUTANEOUS | 0 refills | Status: AC

## 2022-10-17 MED ORDER — PREDNISONE 10 MG PO TABS: 10.0000 mg | ORAL_TABLET | Freq: Every day | ORAL | Status: DC

## 2022-10-17 MED ORDER — PREDNISONE 20 MG PO TABS: 20.0000 mg | ORAL_TABLET | Freq: Every day | ORAL | 0 refills | Status: AC

## 2022-10-17 MED ORDER — PREDNISONE 20 MG PO TABS: 20.0000 mg | ORAL_TABLET | Freq: Every day | ORAL | Status: DC

## 2022-10-17 MED ORDER — ASCORBIC ACID 500 MG PO TABS: 500.0000 mg | ORAL_TABLET | Freq: Every day | ORAL | 0 refills | Status: AC

## 2022-10-17 MED ORDER — FUROSEMIDE 20 MG PO TABS
20.0000 mg | ORAL_TABLET | Freq: Every day | ORAL | Status: DC
  Administered 2022-10-17 – 2022-10-18 (×2): 20 mg via ORAL
  Filled 2022-10-17 (×2): qty 1

## 2022-10-17 MED ORDER — ZINC SULFATE 220 (50 ZN) MG PO CAPS: 220.0000 mg | ORAL_CAPSULE | Freq: Every day | ORAL | 0 refills | Status: AC

## 2022-10-17 MED ORDER — PREDNISONE 20 MG PO TABS: 40.0000 mg | ORAL_TABLET | Freq: Every day | ORAL | 0 refills | Status: AC

## 2022-10-17 MED ORDER — PREDNISONE 20 MG PO TABS
40.0000 mg | ORAL_TABLET | Freq: Every day | ORAL | Status: DC
  Administered 2022-10-17 – 2022-10-18 (×2): 40 mg via ORAL
  Filled 2022-10-17 (×2): qty 2

## 2022-10-17 MED ORDER — PREDNISONE 10 MG PO TABS: 10.0000 mg | ORAL_TABLET | Freq: Every day | ORAL | 0 refills | Status: AC

## 2022-10-17 NOTE — Progress Notes (Signed)
Mobility Specialist Progress Note:   10/17/22 1442  Mobility  Activity Ambulated with assistance in room  Level of Assistance Contact guard assist, steadying assist  Assistive Device Front wheel walker  Distance Ambulated (ft) 180 ft  Activity Response Tolerated well  Mobility Referral Yes  $Mobility charge 1 Mobility   Pt received in chair and eager. No complaints. Pt left in chair with all needs met and call bell in reach.   Andrey Campanile Mobility Specialist Please contact via SecureChat or  Rehab office at [phone]

## 2022-10-17 NOTE — Discharge Summary (Signed)
Physician Discharge Summary  Bethany Nichols HXT:[id number] DOB: 10-20-1933 DOA: 10/14/2022  PCP: [facility]  Admit date: 10/14/2022 Discharge date: 10/18/2022  Admitted From: Home Disposition:  SNF  Discharge Condition:Stable CODE STATUS: DNR Diet recommendation: Heart Healthy  Brief/Interim Summary: 89 yrs old female with PMH significant for PPM placement, CVA, HTN, HLD, Anxiety/ Depression presented  s/p Fall yesterday,  She has developed SOB next day.  She reports that she has developed COVID symptoms about 9 days ago and was tested positive on 12/9.  She usually wears supplemental oxygen 1 L/min at bedtime but has been short of breath and has needed continuous oxygen at 2 L/min.  Patient also reports generalized weakness and recurrent falls.  She hurt her nose in one of the falls and has developed diffuse chest pain,  back pain and is quite reproducible.  CTA ruled out pulmonary embolism but shows multifocal pneumonia consistent with COVID.  Currently she has been weaned to 2 L of oxygen per minute. She remains hemodynamically stable.  We decided to recommend SNF on discharge.  Medically stable for discharge whenever possible.  Following problems were addressed during her hospitalization:  Acute hypoxic respiratory failure sec. to COVID-19 PNA: Patient with COPD on nocturnal oxygen presented with SOB and hypoxia, SpO2 80% on room air on presentation. COVID test+ on 10/06/2022.  Chest x-ray shows multifocal opacities consistent with COVID. CTA ruled out pulmonary embolism but shows multifocal pneumonia consistent with COVID Continue steroids taper  and she has completed Paxlovid treatment. Encourage mobilization and ambulation as much as possible   Hypotension: Continue midodrine 2.5 mg TID.   History of CVA: Continue aspirin.   Patient has mild residual left-sided weakness. She is not on a statin.   Chronic systolic CHF: Appears euvolemic.   Last echo 10/23  shows LVEF 30 to 35%, nonischemic BNP 2728, better than before. On lasix as need Recommend follow-up with cardiology as an outpatient.   Anxiety/depression: Continue Zoloft, Namenda. Continue delirium precautions.   Back pain: Patient has chronic compression fractures in addition to her acute issues which may be contributing to her weakness. Continue lidocaine patch for her back pain, in addition to pain meds.   S/p pacemaker: No issues. F/u Outpatient   COPD: Continue supplemental oxygen.   Continue home inhalers.  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  Some back pain across the back but no focal tenderness    Discharge Instructions  Discharge Instructions     Diet - low sodium heart healthy   Complete by: As directed    Discharge instructions   Complete by: As directed    1)Please take prescribed medications as instructed 2)Do a CBC and BMP tests in a week 3)Follow up with your cardiologist as an outpatient.   Increase activity slowly   Complete by: As directed       Allergies as of 10/18/2022       Reactions   Levaquin [levofloxacin] Rash   Allegra [fexofenadine] Other (See Comments)   Unknown reaction  Medication List     STOP taking these medications    dexamethasone 6 MG tablet Commonly known as: DECADRON       TAKE these medications    acetaminophen 325 MG tablet Commonly known as: TYLENOL Take 650 mg by mouth every 8 (eight) hours as needed for fever (pain).    albuterol 108 (90 Base) MCG/ACT inhaler Commonly known as: VENTOLIN HFA Inhale 2 puffs into the lungs every 4 (four) hours as needed for wheezing or shortness of breath.   alendronate 70 MG tablet Commonly known as: FOSAMAX Take 70 mg by mouth every Wednesday.   ascorbic acid 500 MG tablet Commonly known as: VITAMIN C Take 1 tablet (500 mg total) by mouth daily for 7 days.   aspirin EC 81 MG tablet Take 81 mg by mouth daily.   CeraVe Daily Moisturizing Lotn Apply 1 application  topically 2 (two) times daily as needed (dry skin).   Chest Rub 4.8-1.2-2.6 % Oint Apply 1 application  topically 2 (two) times daily as needed (rash).   docusate sodium 100 MG capsule Commonly known as: COLACE Take 1 capsule (100 mg total) by mouth 2 (two) times daily.   fluticasone 50 MCG/ACT nasal spray Commonly known as: FLONASE Place 1 spray into both nostrils 2 (two) times daily as needed for allergies.   furosemide 20 MG tablet Commonly known as: Lasix Take 1 tablet (20 mg total) by mouth daily as needed. Take for weight gain > 3 lb in 24 hr or > 5 lb in 1 week or for ankle swelling   guaiFENesin 100 MG/5ML liquid Commonly known as: ROBITUSSIN Take 200 mg by mouth every 4 (four) hours as needed for cough.   ibuprofen 200 MG tablet Commonly known as: ADVIL Take 400 mg by mouth every 6 (six) hours as needed for mild pain or fever.   lidocaine 5 % Commonly known as: LIDODERM Place 1 patch onto the skin daily. Remove & Discard patch within 12 hours or as directed by MD   melatonin 3 MG Tabs tablet Take 3 mg by mouth at bedtime as needed (sleep).   memantine 5 MG tablet Commonly known as: NAMENDA Take 5 mg by mouth 2 (two) times daily.   midodrine 2.5 MG tablet Commonly known as: PROAMATINE Take 1 tablet (2.5 mg total) by mouth 3 (three) times daily with meals.   Muscle Rub 10-15 % Crea Apply 1 Application topically 3 (three) times daily as needed for muscle pain.   polyethylene  glycol 17 g packet Commonly known as: MIRALAX / GLYCOLAX Take 17 g by mouth daily as needed for mild constipation.   predniSONE 20 MG tablet Commonly known as: DELTASONE Take 2 tablets (40 mg total) by mouth daily for 3 days.   predniSONE 20 MG tablet Commonly known as: DELTASONE Take 1 tablet (20 mg total) by mouth daily with breakfast for 3 days. Start taking on: October 21, 2022   predniSONE 10 MG tablet Commonly known as: DELTASONE Take 1 tablet (10 mg total) by mouth daily with breakfast for 3 days. Start taking on: October 23, 2022   PreserVision AREDS 2 Caps Take 1 capsule by mouth 2 (two) times daily.   sertraline 100 MG tablet Commonly known as: ZOLOFT Take 150 mg by mouth at bedtime.   zinc sulfate 220 (50 Zn) MG capsule Take 1 capsule (220 mg total) by mouth daily for 7 days.        Contact information for after-discharge care  [facility] .   Service: Skilled Nursing Contact information: [address] [address] [phone]                    Allergies  Allergen Reactions   Levaquin [Levofloxacin] Rash   Allegra [Fexofenadine] Other (See Comments)    Unknown reaction    Consultations: None   Procedures/Studies: CT Angio Chest PE W and/or Wo Contrast  Result Date: 10/15/2022 CLINICAL DATA:  Status post fall, chest pain, evaluate for PE EXAM: CT ANGIOGRAPHY CHEST WITH CONTRAST TECHNIQUE: Multidetector CT imaging of the chest was performed using the standard protocol during bolus administration of intravenous contrast. Multiplanar CT image reconstructions and MIPs were obtained to evaluate the vascular anatomy. RADIATION DOSE REDUCTION: This exam was performed according to the departmental dose-optimization program which includes automated exposure control, adjustment of the mA and/or kV according to patient size and/or use of iterative reconstruction technique. CONTRAST:   41m OMNIPAQUE IOHEXOL 350 MG/ML SOLN COMPARISON:  Chest radiograph and thoracic spine CT dated 10/14/2022 FINDINGS: Cardiovascular: Satisfactory opacification the bilateral pulmonary arteries to the lobar level. No evidence of pulmonary embolism. Study is not tailored for evaluation of thoracic aorta. No evidence of thoracic aortic aneurysm. Atherosclerotic calcifications of the arch. Cardiomegaly.  No pericardial effusion.  Left subclavian pacemaker. Very mild coronary atherosclerosis of the LAD. Mediastinum/Nodes: Small mediastinal lymph nodes which do not meet pathologic CT size criteria. Visualized thyroid is unremarkable. Lungs/Pleura: Multifocal patchy/ground-glass opacities in the lungs bilaterally, suggesting multifocal pneumonia. Superimposed bilateral lower lobe scarring/atelectasis. No suspicious pulmonary nodules. No pleural effusion or pneumothorax. Upper Abdomen: Visualized upper abdomen is grossly unremarkable, noting vascular calcifications. Musculoskeletal: Old bilateral rib fracture deformities. Chronic thoracic compression fracture deformities, including a moderate to severe T7 fracture, unchanged from recent thoracic spine CT. Review of the MIP images confirms the above findings. IMPRESSION: No evidence of pulmonary embolism. Multifocal patchy/interstitial opacities, favoring multifocal pneumonia. Additional ancillary findings as above. Aortic Atherosclerosis (ICD10-I70.0). Electronically Signed   By: SJulian HyM.D.   On: 10/15/2022 03:13   DG Shoulder Left  Result Date: 10/14/2022 CLINICAL DATA:  88year old female status post fall yesterday 1600 hours. Pain. EXAM: LEFT SHOULDER - 2+ VIEW COMPARISON:  Chest radiographs today. Left shoulder series 06/13/2015. FINDINGS: No glenohumeral joint dislocation. Joint spaces and alignment appear normal for age. No fracture of the left clavicle, scapula, or proximal humerus identified. Left chest cardiac pacemaker. Left lung interstitial  opacity, see Chest series today reported separately. IMPRESSION: No acute fracture or dislocation identified about the left shoulder. Electronically Signed   By: HGenevie AnnM.D.   On: 10/14/2022 08:48   DG Shoulder Right  Result Date: 10/14/2022 CLINICAL DATA:  88year old female status post fall yesterday 1600 hours. Pain. EXAM: RIGHT SHOULDER - 2+ VIEW COMPARISON:  Right shoulder series 06/13/2015. Chest radiographs today. FINDINGS: No glenohumeral joint dislocation. Osteopenia. No fracture of the visible right clavicle, scapula, or proximal right humerus. Joint spaces and alignment appear normal for age. Stable visible right chest as described on the dedicated chest series today. IMPRESSION: No acute fracture or dislocation identified about the right shoulder. Electronically Signed   By: HGenevie AnnM.D.   On: 10/14/2022 08:47   DG Chest 2 View  Result Date: 10/14/2022 CLINICAL DATA:  88year old female status post fall yesterday 1600 hours. Pain. EXAM: CHEST - 2 VIEW COMPARISON:  Thoracic spine CT today.  Portable chest 07/30/2022. FINDINGS: AP  and lateral views at 0751 hours. There is cardiomegaly. Other mediastinal contours are within normal limits. Chronic left chest pacemaker. Fairly diffuse increased bilateral pulmonary interstitial opacity compared to October. No pneumothorax. No pleural effusion is evident. No consolidation. Thoracic spine detailed separately.  Negative visible bowel gas. IMPRESSION: 1. Fairly diffuse increased bilateral pulmonary interstitial opacity as described on thoracic spine CT today. Underlying cardiomegaly favors pulmonary edema but viral/atypical respiratory infection not excluded. 2. Thoracic spine detailed separately on CT. Electronically Signed   By: Genevie Ann M.D.   On: 10/14/2022 08:46   DG Hip Unilat W or Wo Pelvis 2-3 Views Left  Result Date: 10/14/2022 CLINICAL DATA:  88 year old female status post fall yesterday 1600 hours. Pain. EXAM: DG HIP (WITH OR WITHOUT  PELVIS) 2-3V LEFT COMPARISON:  CT Abdomen and Pelvis 05/18/2015. Lumbar spine CT today. FINDINGS: Bone mineralization is within normal limits for age. Femoral heads are normally located. Pelvis appears intact with some symphysis degeneration. Grossly intact proximal right femur. Dedicated left hip AP and lateral views. Proximal left femur appears intact. Retained stool but otherwise negative lower abdominal and pelvic visceral contours. IMPRESSION: No acute fracture or dislocation identified about the left hip or pelvis. Electronically Signed   By: Genevie Ann M.D.   On: 10/14/2022 08:44   CT Lumbar Spine Wo Contrast  Result Date: 10/14/2022 CLINICAL DATA:  88 year old female status post fall yesterday 1600 hours. Laceration to nose, contusion to forehead. EXAM: CT LUMBAR SPINE WITHOUT CONTRAST TECHNIQUE: Multidetector CT imaging of the lumbar spine was performed without intravenous contrast administration. Multiplanar CT image reconstructions were also generated. RADIATION DOSE REDUCTION: This exam was performed according to the departmental dose-optimization program which includes automated exposure control, adjustment of the mA and/or kV according to patient size and/or use of iterative reconstruction technique. COMPARISON:  CT thoracic spine today. [facility] CT Abdomen and Pelvis 05/18/2015. FINDINGS: Segmentation: Normal, concordant with the thoracic numbering today. Alignment: Relatively normal lumbar lordosis. Subtle anterolisthesis of L4 on L5. Vertebrae: No prior sagittal images available with the 2016 CT Abdomen and Pelvis. Osteopenia. Lumbar vertebrae appear intact. Visible sacrum and SI joints appear intact. No acute osseous abnormality identified. Paraspinal and other soft tissues: Aortoiliac calcified atherosclerosis. Retained large bowel stool. Negative other visible noncontrast abdominal viscera. Lumbar paraspinal soft tissues are within normal limits. Disc levels: Mild for age lumbar spine  degeneration, primarily facet arthropathy. Comparatively mild lumbar disc bulging. No CT evidence of lumbar spinal stenosis. IMPRESSION: 1. No acute traumatic injury identified in the Lumbar Spine. 2. Mild for age lumbar spine degeneration. 3.  Aortic Atherosclerosis (ICD10-I70.0). Electronically Signed   By: Genevie Ann M.D.   On: 10/14/2022 08:32   CT Thoracic Spine Wo Contrast  Result Date: 10/14/2022 CLINICAL DATA:  88 year old female status post fall yesterday 1600 hours. Laceration to nose, contusion to forehead. EXAM: CT THORACIC SPINE WITHOUT CONTRAST TECHNIQUE: Multidetector CT images of the thoracic were obtained using the standard protocol without intravenous contrast. RADIATION DOSE REDUCTION: This exam was performed according to the departmental dose-optimization program which includes automated exposure control, adjustment of the mA and/or kV according to patient size and/or use of iterative reconstruction technique. COMPARISON:  Cervical spine CT today.  Chest CTA 05/16/2017. FINDINGS: Limited cervical spine imaging:  Detailed separately. Thoracic spine segmentation:  Appears to be normal. Alignment: Chronic but progressed exaggerated thoracic kyphosis since 2018. No significant spondylolisthesis or scoliosis. Vertebrae: Diffuse chronic osteopenia. Mild chronic T1, T2, and T4 compression fractures. Moderate to severe chronic  T7 compression fracture, only mild loss of height there since 2018. No superimposed acute osseous abnormality identified in the thoracic spine. Intermittent chronic posterior rib fractures. No visible acute rib fracture identified. Paraspinal and other soft tissues: Left chest pacemaker. Extensive Calcified aortic atherosclerosis. Chronic cardiomegaly appears progressed since 2018. No pericardial effusion. Small, trace layering left pleural effusion is new. Respiratory motion and atelectatic changes to the major airways which remain patent. Asymmetric patchy and widespread  bilateral pulmonary ground-glass opacity with some septal thickening. No consolidation. Negative visible noncontrast upper abdominal viscera. Thoracic paraspinal soft tissues appear within normal limits. Disc levels: Mild for age thoracic spine degeneration and no CT evidence of thoracic spinal stenosis despite some chronic compression fractures as stated above. IMPRESSION: 1. No acute traumatic injury identified in the Thoracic Spine. Osteopenia with chronic T1, T2, T4, T7 compression fractures. Chronic posterior rib fractures. 2. Chronic but increased cardiomegaly since 2018, with trace layering left pleural effusion, and widespread pulmonary ground-glass opacity with some septal thickening. This is indeterminate for pulmonary edema versus viral/atypical respiratory infection (including COVID-19). 3.  Aortic Atherosclerosis (ICD10-I70.0). Electronically Signed   By: Genevie Ann M.D.   On: 10/14/2022 08:29   CT Cervical Spine Wo Contrast  Result Date: 10/14/2022 CLINICAL DATA:  88 year old female status post fall yesterday 1600 hours. Laceration to nose, contusion to forehead. EXAM: CT CERVICAL SPINE WITHOUT CONTRAST TECHNIQUE: Multidetector CT imaging of the cervical spine was performed without intravenous contrast. Multiplanar CT image reconstructions were also generated. RADIATION DOSE REDUCTION: This exam was performed according to the departmental dose-optimization program which includes automated exposure control, adjustment of the mA and/or kV according to patient size and/or use of iterative reconstruction technique. COMPARISON:  Cervical spine CT 06/13/2015. FINDINGS: Alignment: Stable cervical lordosis since 2016. Cervicothoracic junction alignment is within normal limits. Bilateral posterior element alignment is within normal limits. Skull base and vertebrae: Visualized skull base is intact. No atlanto-occipital dissociation. C1 and C2 appear intact and aligned. No acute osseous abnormality identified.  Soft tissues and spinal canal: No prevertebral fluid or swelling. No visible canal hematoma. Right carotid calcified atherosclerosis but otherwise negative visible noncontrast neck soft tissues. Disc levels: Mild for age cervical spine degeneration appears stable since 2016, with fairly capacious spinal canal. Upper chest: Thoracic spine is detailed separately today, but chronic T1, T2, and T3 superior endplate compression appears stable since 2016. Patchy and confluent, somewhat geometric areas of abnormal pulmonary ground-glass opacity with some septal thickening in both lung apices. Calcified aortic atherosclerosis. Left chest cardiac pacemaker. See also thoracic spine CT. IMPRESSION: 1. No acute traumatic injury identified in the Cervical Spine. 2. Abnormal pulmonary ground-glass opacity and septal thickening in both lung apices. See Thoracic Spine CT reported separately. 3.  Aortic Atherosclerosis (ICD10-I70.0). Electronically Signed   By: Genevie Ann M.D.   On: 10/14/2022 08:23   CT HEAD WO CONTRAST (5MM)  Result Date: 10/14/2022 CLINICAL DATA:  88 year old female status post fall yesterday 1600 hours. Laceration to nose, contusion to forehead. EXAM: CT HEAD WITHOUT CONTRAST TECHNIQUE: Contiguous axial images were obtained from the base of the skull through the vertex without intravenous contrast. RADIATION DOSE REDUCTION: This exam was performed according to the departmental dose-optimization program which includes automated exposure control, adjustment of the mA and/or kV according to patient size and/or use of iterative reconstruction technique. COMPARISON:  Brain MRI 08/06/2017.  Head CT 02/23/2020. FINDINGS: Brain: Cerebral volume not significantly changed since 2021. No midline shift, ventriculomegaly, mass effect, evidence of mass lesion,  intracranial hemorrhage or evidence of cortically based acute infarction. Chronic posterior right corona radiata lacunar infarct tracking to the posterior lentiform  is stable, along with patchy additional bilateral periventricular white matter hypodensity. Vascular: Calcified atherosclerosis at the skull base. No suspicious intracranial vascular hyperdensity. Skull: Chronic left mastoidectomy. Acute appearing left nasal bone fractures are mildly displaced, along with fractured left nasal process of the maxilla on series 4, image 15. Difficult to exclude nondisplaced fracture of the left maxillary sinus anterior wall. See coronal image 16. Calvarium appears intact. No other No acute osseous abnormality identified. Sinuses/Orbits: Small low to intermediate density fluid level layering in the left maxillary sinus. And similar low to intermediate density bubbly opacity at the frontoethmoidal recesses, frontal sinuses. Chronic left mastoidectomy is aerated. Other mastoids, tympanic cavities and paranasal sinuses are clear. Other: Mild left forehead scalp contusion on series 4, image 46. Underlying frontal bone intact. No scalp soft tissue gas. Orbits soft tissues appears stable and negative. IMPRESSION: 1. Mildly displaced left nasal bones and left nasal process of the maxilla fractures. And difficult to exclude nondisplaced fracture of the left maxillary sinus anterior wall, with small volume fluid in the left maxillary and frontal sinuses which could in part be blood products. 2. Mild left forehead scalp contusion. No underlying skull fracture. 3. No acute intracranial abnormality. Chronic small vessel disease. Electronically Signed   By: Genevie Ann M.D.   On: 10/14/2022 08:14     Discharge Exam: Vitals:   10/17/22 1933 10/18/22 0347  BP: (!) 125/91 126/76  Pulse: 73 (!) 58  Resp:    Temp: 97.6 F (36.4 C) (!) 97.5 F (36.4 C)  SpO2: 92% 98%   Vitals:   10/17/22 1310 10/17/22 1500 10/17/22 1933 10/18/22 0347  BP: 103/68 115/73 (!) 125/91 126/76  Pulse: 89 70 73 (!) 58  Resp: 16 15    Temp: 98.2 F (36.8 C)  97.6 F (36.4 C) (!) 97.5 F (36.4 C)  TempSrc: Oral  Oral Oral Oral  SpO2: 97% 97% 92% 98%  Weight:        General: Pt is alert awake not in distress on 4 L nasal cannula.  Weak and elderly, frail-appearing..  Cardiovascular: RRR, S1/S2 +, no rubs, no gallops Respiratory: Mild crackles bilaterally at the lower bases. Abdominal: Soft, NT, ND, bowel sounds + Extremities: no edema, no cyanosis   The results of significant diagnostics from this hospitalization (including imaging, microbiology, ancillary and laboratory) are listed below for reference.     Microbiology: Recent Results (from the past 240 hour(s))  Resp panel by RT-PCR (RSV, Flu A&B, Covid) Anterior Nasal Swab     Status: Abnormal   Collection Time: 10/14/22  2:12 PM   Specimen: Anterior Nasal Swab  Result Value Ref Range Status   SARS Coronavirus 2 by RT PCR POSITIVE (A) NEGATIVE Final    Comment: (NOTE) SARS-CoV-2 target nucleic acids are DETECTED.  The SARS-CoV-2 RNA is generally detectable in upper respiratory specimens during the acute phase of infection. Positive results are indicative of the presence of the identified virus, but do not rule out bacterial infection or co-infection with other pathogens not detected by the test. Clinical correlation with patient history and other diagnostic information is necessary to determine patient infection status. The expected result is Negative.  Fact Sheet for Patients: EntrepreneurPulse.com.au  Fact Sheet for Healthcare Providers: IncredibleEmployment.be  This test is not yet approved or cleared by the [place] FDA and  has been authorized for detection  and/or diagnosis of SARS-CoV-2 by FDA under an Emergency Use Authorization (EUA).  This EUA will remain in effect (meaning this test can be used) for the duration of  the COVID-19 declaration under Section 564(b)(1) of the A ct, 21 U.S.C. section 360bbb-3(b)(1), unless the authorization is terminated or revoked sooner.      Influenza A by PCR NEGATIVE NEGATIVE Final   Influenza B by PCR NEGATIVE NEGATIVE Final    Comment: (NOTE) The Xpert Xpress SARS-CoV-2/FLU/RSV plus assay is intended as an aid in the diagnosis of influenza from Nasopharyngeal swab specimens and should not be used as a sole basis for treatment. Nasal washings and aspirates are unacceptable for Xpert Xpress SARS-CoV-2/FLU/RSV testing.  Fact Sheet for Patients: EntrepreneurPulse.com.au  Fact Sheet for Healthcare Providers: IncredibleEmployment.be  This test is not yet approved or cleared by the [place] FDA and has been authorized for detection and/or diagnosis of SARS-CoV-2 by FDA under an Emergency Use Authorization (EUA). This EUA will remain in effect (meaning this test can be used) for the duration of the COVID-19 declaration under Section 564(b)(1) of the Act, 21 U.S.C. section 360bbb-3(b)(1), unless the authorization is terminated or revoked.     Resp Syncytial Virus by PCR NEGATIVE NEGATIVE Final    Comment: (NOTE) Fact Sheet for Patients: EntrepreneurPulse.com.au  Fact Sheet for Healthcare Providers: IncredibleEmployment.be  This test is not yet approved or cleared by the [place] FDA and has been authorized for detection and/or diagnosis of SARS-CoV-2 by FDA under an Emergency Use Authorization (EUA). This EUA will remain in effect (meaning this test can be used) for the duration of the COVID-19 declaration under Section 564(b)(1) of the Act, 21 U.S.C. section 360bbb-3(b)(1), unless the authorization is terminated or revoked.  Performed at [facility], [place] [address], [place], [place] [address]      Labs: BNP (last 3 results) Recent Labs    07/31/22 1752 10/16/22 0306  BNP 702.2* 6,389.3*   Basic Metabolic Panel: Recent Labs  Lab 10/14/22 0635 10/15/22 0318 10/16/22 0306 10/17/22 0235 10/18/22 0516  NA 129* 131*  131* 133* 132*  K 3.6 4.0 3.9 4.5 4.3  CL 94* 99 98 98 97*  CO2 '25 22 24 26 27  '$ GLUCOSE 104* 132* 130* 129* 118*  BUN 24* '20 19 21 '$ 24*  CREATININE 0.82 0.52 0.68 0.62 0.74  CALCIUM 8.9 8.4* 8.7* 8.8* 8.6*  MG  --   --   --  2.1  --   PHOS  --   --   --  3.6  --    Liver Function Tests: Recent Labs  Lab 10/14/22 0635 10/15/22 0318 10/16/22 0306 10/17/22 0235 10/18/22 0516  AST 34 '21 16 19 21  '$ ALT '24 18 16 14 16  '$ ALKPHOS 56 58 62 56 53  BILITOT 0.7 0.8 0.7 0.3 0.6  PROT 6.7 6.4* 6.5 6.4* 6.2*  ALBUMIN 3.3* 3.0* 3.0* 2.9* 2.8*   No results for input(s): "LIPASE", "AMYLASE" in the last 168 hours. No results for input(s): "AMMONIA" in the last 168 hours. CBC: Recent Labs  Lab 10/14/22 0635 10/15/22 0318 10/16/22 0306 10/17/22 0235 10/18/22 0516  WBC 12.7* 9.8 11.2* 10.7* 10.8*  NEUTROABS  --  8.4* 8.8* 8.6* 8.2*  HGB 12.6 13.4 13.4 13.3 13.1  HCT 37.6 39.7 39.9 39.1 40.1  MCV 88.7 89.2 89.5 90.1 90.1  PLT 318 310 306 296 306   Cardiac Enzymes: No results for input(s): "CKTOTAL", "CKMB", "CKMBINDEX", "TROPONINI" in the last 168  hours. BNP: Invalid input(s): "POCBNP" CBG: No results for input(s): "GLUCAP" in the last 168 hours. D-Dimer No results for input(s): "DDIMER" in the last 72 hours.  Hgb A1c No results for input(s): "HGBA1C" in the last 72 hours. Lipid Profile No results for input(s): "CHOL", "HDL", "LDLCALC", "TRIG", "CHOLHDL", "LDLDIRECT" in the last 72 hours. Thyroid function studies No results for input(s): "TSH", "T4TOTAL", "T3FREE", "THYROIDAB" in the last 72 hours.  Invalid input(s): "FREET3" Anemia work up No results for input(s): "VITAMINB12", "FOLATE", "FERRITIN", "TIBC", "IRON", "RETICCTPCT" in the last 72 hours.  Urinalysis    Component Value Date/Time   COLORURINE YELLOW 10/14/2022 2125   APPEARANCEUR CLEAR 10/14/2022 2125   LABSPEC 1.017 10/14/2022 2125   PHURINE 5.0 10/14/2022 2125   GLUCOSEU NEGATIVE 10/14/2022 2125   HGBUR  NEGATIVE 10/14/2022 2125   BILIRUBINUR NEGATIVE 10/14/2022 2125   KETONESUR NEGATIVE 10/14/2022 2125   PROTEINUR NEGATIVE 10/14/2022 2125   NITRITE NEGATIVE 10/14/2022 2125   LEUKOCYTESUR MODERATE (A) 10/14/2022 2125   Sepsis Labs Recent Labs  Lab 10/15/22 0318 10/16/22 0306 10/17/22 0235 10/18/22 0516  WBC 9.8 11.2* 10.7* 10.8*   Microbiology Recent Results (from the past 240 hour(s))  Resp panel by RT-PCR (RSV, Flu A&B, Covid) Anterior Nasal Swab     Status: Abnormal   Collection Time: 10/14/22  2:12 PM   Specimen: Anterior Nasal Swab  Result Value Ref Range Status   SARS Coronavirus 2 by RT PCR POSITIVE (A) NEGATIVE Final    Comment: (NOTE) SARS-CoV-2 target nucleic acids are DETECTED.  The SARS-CoV-2 RNA is generally detectable in upper respiratory specimens during the acute phase of infection. Positive results are indicative of the presence of the identified virus, but do not rule out bacterial infection or co-infection with other pathogens not detected by the test. Clinical correlation with patient history and other diagnostic information is necessary to determine patient infection status. The expected result is Negative.  Fact Sheet for Patients: EntrepreneurPulse.com.au  Fact Sheet for Healthcare Providers: IncredibleEmployment.be  This test is not yet approved or cleared by the [place] FDA and  has been authorized for detection and/or diagnosis of SARS-CoV-2 by FDA under an Emergency Use Authorization (EUA).  This EUA will remain in effect (meaning this test can be used) for the duration of  the COVID-19 declaration under Section 564(b)(1) of the A ct, 21 U.S.C. section 360bbb-3(b)(1), unless the authorization is terminated or revoked sooner.     Influenza A by PCR NEGATIVE NEGATIVE Final   Influenza B by PCR NEGATIVE NEGATIVE Final    Comment: (NOTE) The Xpert Xpress SARS-CoV-2/FLU/RSV plus assay is intended as an  aid in the diagnosis of influenza from Nasopharyngeal swab specimens and should not be used as a sole basis for treatment. Nasal washings and aspirates are unacceptable for Xpert Xpress SARS-CoV-2/FLU/RSV testing.  Fact Sheet for Patients: EntrepreneurPulse.com.au  Fact Sheet for Healthcare Providers: IncredibleEmployment.be  This test is not yet approved or cleared by the [place] FDA and has been authorized for detection and/or diagnosis of SARS-CoV-2 by FDA under an Emergency Use Authorization (EUA). This EUA will remain in effect (meaning this test can be used) for the duration of the COVID-19 declaration under Section 564(b)(1) of the Act, 21 U.S.C. section 360bbb-3(b)(1), unless the authorization is terminated or revoked.     Resp Syncytial Virus by PCR NEGATIVE NEGATIVE Final    Comment: (NOTE) Fact Sheet for Patients: EntrepreneurPulse.com.au  Fact Sheet for Healthcare Providers: IncredibleEmployment.be  This test is not yet  approved or cleared by the [place] and has been authorized for detection and/or diagnosis of SARS-CoV-2 by FDA under an Emergency Use Authorization (EUA). This EUA will remain in effect (meaning this test can be used) for the duration of the COVID-19 declaration under Section 564(b)(1) of the Act, 21 U.S.C. section 360bbb-3(b)(1), unless the authorization is terminated or revoked.  Performed at [facility], [place] [address] [place], [place] [address]     Please note: You were cared for by a hospitalist during your hospital stay. Once you are discharged, your primary care physician will handle any further medical issues. Please note that NO REFILLS for any discharge medications will be authorized once you are discharged, as it is imperative that you return to your primary care physician (or establish a relationship with a primary care physician if you do  not have one) for your post hospital discharge needs so that they can reassess your need for medications and monitor your lab values.    Time coordinating discharge: 40 minutes  SIGNED:   Antonieta Pert, MD  [facility] 10/18/2022, 10:49 AM Pager [phone]  If 7PM-7AM, please contact night-coverage [email or web address] Password [email or web address]

## 2022-10-17 NOTE — Progress Notes (Addendum)
Physical Therapy Treatment Patient Details Name: Bethany Nichols MRN: [id number] DOB: August 27, 1933 Today's Date: 10/17/2022   History of Present Illness This 88 yrs old female admitted 12/17  s/p Fall 12/17.  She developed SOB.  She reports that she has developed COVID symptoms about 9 days ago and was tested positive on 12/9.  She usually wears supplemental oxygen 1 L/min at bedtime but has been short of breath and has needed continuous oxygen at 2 L/min.  Patient also reports generalized weakness and recurrent falls.  She hurt her nose in one of the falls and has developed diffuse chest pain,  back pain and is quite reproducible.  CTA ruled out pulmonary embolism but shows multifocal pneumonia consistent with COVID. PMH significant for PPM placement, CVA, HTN, HLD, Anxiety/ Depression    PT Comments    Pt admitted with above diagnosis. Pt making progress needing only min assist for mobility. Pt with incr steadiness with RW.  Hopeful that [facility] can provide level of care pt needs. Of note, pt did require 4LO2 to keep sats >88% with activity and 2LO2 at rest.  Pt currently with functional limitations due to balance and endurance deficits. Pt will benefit from skilled PT to increase their independence and safety with mobility to allow discharge to the venue listed below.     SATURATION QUALIFICATIONS: (This note is used to comply with regulatory documentation for home oxygen)  Patient Saturations on Room Air at Rest = 86%  Patient Saturations on Room Air while Ambulating = NT as desaturation at rest  Patient Saturations on 4 Liters of oxygen while Ambulating = 88%  Please briefly explain why patient needs home oxygen:Pt requires 2LO2 at rest and 4LO2 with activity to maintain O2 level >88%.  Recommendations for follow up therapy are one component of a multi-disciplinary discharge planning process, led by the attending physician.  Recommendations may be updated based on patient status, additional  functional criteria and insurance authorization.  Follow Up Recommendations  Home health PT (and possibly incr [facility] services initially)     Assistance Recommended at Discharge Intermittent Supervision/Assistance  Patient can return home with the following A little help with walking and/or transfers;A little help with bathing/dressing/bathroom;Assistance with cooking/housework;Assist for transportation;Help with stairs or ramp for entrance   Equipment Recommendations  None recommended by PT    Recommendations for Other Services       Precautions / Restrictions Precautions Precautions: Fall Restrictions Weight Bearing Restrictions: No     Mobility  Bed Mobility Overal bed mobility: Needs Assistance Bed Mobility: Supine to Sit, Sit to Supine     Supine to sit: Min assist Sit to supine: Min assist   General bed mobility comments: pt pleased she does not have back pain today, min assist and use of bed pad for hips to EOB, HOB up    Transfers Overall transfer level: Needs assistance Equipment used: Rolling walker (2 wheels) Transfers: Sit to/from Stand Sit to Stand: Min assist           General transfer comment: Pt needed min assist to power up to RW    Ambulation/Gait Ambulation/Gait assistance: Min assist Gait Distance (Feet): 75 Feet Assistive device: Rolling walker (2 wheels) Gait Pattern/deviations: Step-through pattern, Decreased stride length, Shuffle, Drifts right/left   Gait velocity interpretation: <1.31 ft/sec, indicative of household ambulator   General Gait Details: Pt obtained balance fairly well with RW needing min assist to steady at times.  Pt with slow and cautious gait. Pt flexes at  trunk and states this is premorbid.  Pt walked to bathroom and urinated. Total assist to clean self and min assist to pull up mesh panties. Pt desat to 86% on RA.  REplaced O2 at 2L at rest and sats >90%.  Pt fatigues quickly and requring 4LO2 to maintain sats >88% with  ambulation.   Stairs             Wheelchair Mobility    Modified Rankin (Stroke Patients Only)       Balance Overall balance assessment: Needs assistance Sitting-balance support: No upper extremity supported, Feet supported Sitting balance-Leahy Scale: Fair     Standing balance support: Bilateral upper extremity supported, During functional activity Standing balance-Leahy Scale: Poor Standing balance comment: relies on UE support                            Cognition Arousal/Alertness: Awake/alert Behavior During Therapy: WFL for tasks assessed/performed Overall Cognitive Status: Within Functional Limits for tasks assessed                                          Exercises      General Comments        Pertinent Vitals/Pain Pain Assessment Pain Assessment: No/denies pain    Home Living                          Prior Function            PT Goals (current goals can now be found in the care plan section) Acute Rehab PT Goals Patient Stated Goal: to get betteerr and go home Progress towards PT goals: Progressing toward goals    Frequency    Min 3X/week      PT Plan Current plan remains appropriate    Co-evaluation              AM-PAC PT "6 Clicks" Mobility   Outcome Measure  Help needed turning from your back to your side while in a flat bed without using bedrails?: None Help needed moving from lying on your back to sitting on the side of a flat bed without using bedrails?: A Little Help needed moving to and from a bed to a chair (including a wheelchair)?: A Little Help needed standing up from a chair using your arms (e.g., wheelchair or bedside chair)?: A Little Help needed to walk in hospital room?: A Little Help needed climbing 3-5 steps with a railing? : A Lot 6 Click Score: 18    End of Session Equipment Utilized During Treatment: Gait belt;Oxygen Activity Tolerance: Patient limited by  fatigue Patient left: in chair;with call bell/phone within reach Nurse Communication: Mobility status PT Visit Diagnosis: Unsteadiness on feet (R26.81);Muscle weakness (generalized) (M62.81)     Time: 0017-4944 PT Time Calculation (min) (ACUTE ONLY): 31 min  Charges:  $Gait Training: 23-37 mins                     .mcc   Evangelia Whitaker F Jayley Hustead 10/17/2022, 1:19 PM

## 2022-10-17 NOTE — Progress Notes (Signed)
SATURATION QUALIFICATIONS: (This note is used to comply with regulatory documentation for home oxygen)  Patient Saturations on Room Air at Rest = 86%  Patient Saturations on Room Air while Ambulating = NT as desaturation at rest  Patient Saturations on 4 Liters of oxygen while Ambulating = 88%  Please briefly explain why patient needs home oxygen:Pt requires 2LO2 at rest and 4LO2 with activity to maintain O2 level >88%. Chanc Kervin M,PT Acute Rehab Services [phone]

## 2022-10-17 NOTE — Progress Notes (Signed)
Occupational Therapy Treatment Patient Details Name: RADA ZEGERS MRN: [id number] DOB: 1933/09/16 Today's Date: 10/17/2022   History of present illness This 88 yrs old female admitted 12/17  s/p Fall 12/17.  She developed SOB.  She reports that she has developed COVID symptoms about 9 days ago and was tested positive on 12/9.  She usually wears supplemental oxygen 1 L/min at bedtime but has been short of breath and has needed continuous oxygen at 2 L/min.  Patient also reports generalized weakness and recurrent falls.  She hurt her nose in one of the falls and has developed diffuse chest pain,  back pain and is quite reproducible.  CTA ruled out pulmonary embolism but shows multifocal pneumonia consistent with COVID. PMH significant for PPM placement, CVA, HTN, HLD, Anxiety/ Depression   OT comments  Pt reports her back feeling much better today. Assisted pt to EOB for grooming activities and UB dressing with set up to min assist, pt very grateful for assistance.  Pt declined OOB to chair with arrival of breakfast tray, preferring to eat in bed and get OOB later. Per RN, pt's appetite has been poor so prioritized pt eating. Pt on 3L 02 with Sp02 of 94%.   Recommendations for follow up therapy are one component of a multi-disciplinary discharge planning process, led by the attending physician.  Recommendations may be updated based on patient status, additional functional criteria and insurance authorization.    Follow Up Recommendations  Skilled nursing-short term rehab (<3 hours/day)     Assistance Recommended at Discharge Frequent or constant Supervision/Assistance  Patient can return home with the following  A little help with walking and/or transfers;A lot of help with bathing/dressing/bathroom;Assistance with cooking/housework;Direct supervision/assist for medications management;Direct supervision/assist for financial management;Assist for transportation;Help with stairs or ramp for  entrance   Equipment Recommendations  None recommended by OT    Recommendations for Other Services      Precautions / Restrictions Precautions Precautions: Fall Restrictions Weight Bearing Restrictions: No       Mobility Bed Mobility Overal bed mobility: Needs Assistance Bed Mobility: Supine to Sit, Sit to Supine     Supine to sit: Min assist Sit to supine: Min assist   General bed mobility comments: pt pleased she does not have back pain today, min assist and use of bed pad for hips to EOB, HOB up, assist for LEs back into bed to complete eating her breakfast    Transfers                   General transfer comment: pt declined OOB at this time, wanting to eat breakfast at bed level     Balance Overall balance assessment: Needs assistance   Sitting balance-Leahy Scale: Fair                                     ADL either performed or assessed with clinical judgement   ADL Overall ADL's : Needs assistance/impaired Eating/Feeding: Independent;Bed level   Grooming: Brushing hair;Wash/dry hands;Wash/dry face;Sitting;Supervision/safety           Upper Body Dressing : Minimal assistance;Sitting                          Extremity/Trunk Assessment              Vision       Perception  Praxis      Cognition Arousal/Alertness: Awake/alert Behavior During Therapy: WFL for tasks assessed/performed Overall Cognitive Status: Within Functional Limits for tasks assessed                                          Exercises      Shoulder Instructions       General Comments      Pertinent Vitals/ Pain       Pain Assessment Pain Assessment: No/denies pain  Home Living                                          Prior Functioning/Environment              Frequency  Min 2X/week        Progress Toward Goals  OT Goals(current goals can now be found in the care plan  section)  Progress towards OT goals: Progressing toward goals  Acute Rehab OT Goals OT Goal Formulation: With patient Time For Goal Achievement: 10/29/22 Potential to Achieve Goals: Good  Plan Discharge plan needs to be updated    Co-evaluation                 AM-PAC OT "6 Clicks" Daily Activity     Outcome Measure   Help from another person eating meals?: A Little Help from another person taking care of personal grooming?: A Little Help from another person toileting, which includes using toliet, bedpan, or urinal?: A Little Help from another person bathing (including washing, rinsing, drying)?: A Lot Help from another person to put on and taking off regular upper body clothing?: A Little Help from another person to put on and taking off regular lower body clothing?: A Lot 6 Click Score: 16    End of Session Equipment Utilized During Treatment: Oxygen (3L)  OT Visit Diagnosis: Unsteadiness on feet (R26.81);Other abnormalities of gait and mobility (R26.89);Muscle weakness (generalized) (M62.81);History of falling (Z91.81);Repeated falls (R29.6)   Activity Tolerance Patient tolerated treatment well   Patient Left in bed;with call bell/phone within reach;with bed alarm set   Nurse Communication          Time: [phone] OT Time Calculation (min): 24 min  Charges: OT General Charges $OT Visit: 1 Visit OT Treatments $Self Care/Home Management : 23-37 mins  Cleta Alberts, OTR/L Acute Rehabilitation Services Office: [phone]   Malka So 10/17/2022, 9:14 AM

## 2022-10-17 NOTE — TOC Progression Note (Signed)
Transition of Care [facility]) - Progression Note    Patient Details  Name: Bethany Nichols MRN: [id number] Date of Birth: 1933/08/23  Transition of Care [facility]) CM/SW [place], RN Phone Number: 10/17/2022, 10:09 AM  Clinical Narrative:    CM called and spoke with Martinique Hartsoe, CM at [facility] and patient will have bed availability for SNF placement at [facility] in [place], [place] on Thursday or Friday.  [facility] is only in network with this facility and the facility is full at this time and is unable to admit the patient until Thursday or Friday when a bed opens.  [facility] plans to provide wheelchair transportation to the facility once bed is available.  [facility] to provide the wheelchair and oxygen for transport.  Attending MD is aware.   Planned Disposition: Skilled Nursing Facility Barriers to Discharge: Continued Medical Work up  Expected Discharge Plan and Services     Post Acute Care Choice: [facility] [place] Living arrangements for the past 2 months: Single Family Home                                       Social Determinants of Health (SDOH) Interventions SDOH Screenings   Food Insecurity: No Food Insecurity (10/14/2022)  Housing: Low Risk  (10/14/2022)  Transportation Needs: No Transportation Needs (10/14/2022)  Utilities: Not At Risk (10/14/2022)  Tobacco Use: Low Risk  (10/14/2022)    Readmission Risk Interventions    10/16/2022   10:20 AM  Readmission Risk Prevention Plan  Post Dischage Appt Complete  Medication Screening Complete  Transportation Screening Complete

## 2022-10-18 LAB — COMPREHENSIVE METABOLIC PANEL
ALT: 16 U/L (ref 0–44)
AST: 21 U/L (ref 15–41)
Albumin: 2.8 g/dL — ABNORMAL LOW (ref 3.5–5.0)
Alkaline Phosphatase: 53 U/L (ref 38–126)
Anion gap: 8 (ref 5–15)
BUN: 24 mg/dL — ABNORMAL HIGH (ref 8–23)
CO2: 27 mmol/L (ref 22–32)
Calcium: 8.6 mg/dL — ABNORMAL LOW (ref 8.9–10.3)
Chloride: 97 mmol/L — ABNORMAL LOW (ref 98–111)
Creatinine, Ser: 0.74 mg/dL (ref 0.44–1.00)
GFR, Estimated: >60 mL/min (ref >60)
Glucose, Bld: 118 mg/dL — ABNORMAL HIGH (ref 70–99)
Potassium: 4.3 mmol/L (ref 3.5–5.1)
Sodium: 132 mmol/L — ABNORMAL LOW (ref 135–145)
Total Bilirubin: 0.6 mg/dL (ref 0.3–1.2)
Total Protein: 6.2 g/dL — ABNORMAL LOW (ref 6.5–8.1)

## 2022-10-18 LAB — CBC WITH DIFFERENTIAL/PLATELET
Abs Immature Granulocytes: 0.19 10*3/uL — ABNORMAL HIGH (ref 0.00–0.07)
Basophils Absolute: 0 10*3/uL (ref 0.0–0.1)
Basophils Relative: 0 %
Eosinophils Absolute: 0.1 10*3/uL (ref 0.0–0.5)
Eosinophils Relative: 1 %
HCT: 40.1 % (ref 36.0–46.0)
Hemoglobin: 13.1 g/dL (ref 12.0–15.0)
Immature Granulocytes: 2 %
Lymphocytes Relative: 14 %
Lymphs Abs: 1.5 10*3/uL (ref 0.7–4.0)
MCH: 29.4 pg (ref 26.0–34.0)
MCHC: 32.7 g/dL (ref 30.0–36.0)
MCV: 90.1 fL (ref 80.0–100.0)
Monocytes Absolute: 0.7 10*3/uL (ref 0.1–1.0)
Monocytes Relative: 6 %
Neutro Abs: 8.2 10*3/uL — ABNORMAL HIGH (ref 1.7–7.7)
Neutrophils Relative %: 77 %
Platelets: 306 10*3/uL (ref 150–400)
RBC: 4.45 MIL/uL (ref 3.87–5.11)
RDW: 14.6 % (ref 11.5–15.5)
WBC: 10.8 10*3/uL — ABNORMAL HIGH (ref 4.0–10.5)
nRBC: 0 % (ref 0.0–0.2)

## 2022-10-18 NOTE — Progress Notes (Signed)
Mobility Specialist: Progress Note   10/18/22 1041  Mobility  Activity Ambulated with assistance in room  Level of Assistance Contact guard assist, steadying assist  Assistive Device Front wheel walker  Distance Ambulated (ft) 90 ft  Activity Response Tolerated well  Mobility Referral Yes  $Mobility charge 1 Mobility   Pre-Mobility: 98% SpO2 Post-Mobility: 100% SpO2  Pt received in the bed and agreeable to mobility. Ambulated on 4 L/min [place]. C/o dizziness upon sitting EOB, resolved quickly. Able to ambulate to the door and back x3. After returning to the bed pt c/o back pain, no rating given, and requesting pain medication. RN notified. Pt back in bed with call bell and phone in reach. Bed alarm is on.   Bethany Nichols Mobility Specialist Please contact via SecureChat or Rehab office at [phone]

## 2022-10-18 NOTE — Plan of Care (Signed)

## 2022-10-18 NOTE — Progress Notes (Signed)
Patient is ready for discharge. IV removed and AVS packet given to [facility].

## 2022-10-18 NOTE — Progress Notes (Signed)
Report was called and given to Devin Going LPN she is aware that she hasn't had a bowel movement but is on a bowel regimen and she is also aware that she is currently on 2 to 3 L of oxygen.

## 2022-10-18 NOTE — TOC Progression Note (Addendum)
Transition of Care [facility]) - Progression Note    Patient Details  Name: Bethany Nichols MRN: [id number] Date of Birth: 1933/10/28  Transition of Care [facility]) CM/SW [place], RN Phone Number: 10/18/2022, 8:38 AM  Clinical Narrative:    CM called and left a voicemail message with Laurann Montana, CM with [facility] to follow up regarding bed availability for admission.  Will follow up for possible bed opening today or tomorrow - [facility] facility was full to capacity yesterday and unable to accept her admission.  CM and MSW will continue to follow for SNF admission.  10/18/2022 1106-  [facility] has available bed open for the patient today.  I called Martinique, [place] with [facility] and she arranged [facility] transport to pick the patient up for transport to the facility around 530 pm today.  [facility] plans to arrive at [facility] entrance at 530 pm.  They will call the [facility] [place] desk to alert their arrival.  Please have the tech transport the patient with portable O2 tank and she can transfer to their [facility].  Please call nursing report to [facility] at [phone] for room 106.   Planned Disposition: Skilled Nursing Facility Barriers to Discharge: Continued Medical Work up  Expected Discharge Plan and Services     Post Acute Care Choice: [place] Living arrangements for the past 2 months: Single Family Home                                       Social Determinants of Health (SDOH) Interventions SDOH Screenings   Food Insecurity: No Food Insecurity (10/14/2022)  Housing: Low Risk  (10/14/2022)  Transportation Needs: No Transportation Needs (10/14/2022)  Utilities: Not At Risk (10/14/2022)  Tobacco Use: Low Risk  (10/14/2022)    Readmission Risk Interventions    10/16/2022   10:20 AM  Readmission Risk Prevention Plan  Post Dischage Appt Complete  Medication Screening Complete  Transportation Screening Complete

## 2022-10-18 NOTE — Progress Notes (Signed)
Patient seen and examined this morning, she feels well.  PT worked with her and ambulated 90 ft no hypoxia doing overall well. Discharge summary reviewed patient is medically stable for discharge to skilled nursing facility continue prednisone to complete the course Discussed with social worker.  Continue precaution for COVID as per protocol

## 2022-11-29 DEATH — deceased
